# Patient Record
Sex: Male | Born: 1975 | Race: Black or African American | Hispanic: No | Marital: Single | Smoking: Never smoker
Health system: Southern US, Community
[De-identification: ages and names within clinical notes are randomized; demographics above are authoritative.]

## PROBLEM LIST (undated history)

## (undated) ENCOUNTER — Emergency Department (HOSPITAL_COMMUNITY): Payer: Commercial Managed Care - HMO | Source: Home / Self Care

---

## 1998-08-28 ENCOUNTER — Emergency Department (HOSPITAL_COMMUNITY): Admission: EM | Admit: 1998-08-28 | Discharge: 1998-08-28 | Payer: Self-pay | Admitting: Family Medicine

## 1999-10-25 ENCOUNTER — Emergency Department (HOSPITAL_COMMUNITY): Admission: EM | Admit: 1999-10-25 | Discharge: 1999-10-25 | Payer: Self-pay | Admitting: Emergency Medicine

## 2000-09-07 ENCOUNTER — Emergency Department (HOSPITAL_COMMUNITY): Admission: EM | Admit: 2000-09-07 | Discharge: 2000-09-07 | Payer: Self-pay | Admitting: Emergency Medicine

## 2000-10-06 ENCOUNTER — Emergency Department (HOSPITAL_COMMUNITY): Admission: EM | Admit: 2000-10-06 | Discharge: 2000-10-06 | Payer: Self-pay | Admitting: Emergency Medicine

## 2000-11-07 ENCOUNTER — Encounter: Payer: Self-pay | Admitting: Emergency Medicine

## 2000-11-07 ENCOUNTER — Emergency Department (HOSPITAL_COMMUNITY): Admission: EM | Admit: 2000-11-07 | Discharge: 2000-11-07 | Payer: Self-pay | Admitting: Emergency Medicine

## 2003-10-19 ENCOUNTER — Emergency Department (HOSPITAL_COMMUNITY): Admission: EM | Admit: 2003-10-19 | Discharge: 2003-10-19 | Payer: Self-pay | Admitting: Emergency Medicine

## 2005-02-28 ENCOUNTER — Emergency Department (HOSPITAL_COMMUNITY): Admission: EM | Admit: 2005-02-28 | Discharge: 2005-02-28 | Payer: Self-pay | Admitting: Family Medicine

## 2007-05-21 ENCOUNTER — Emergency Department (HOSPITAL_COMMUNITY): Admission: EM | Admit: 2007-05-21 | Discharge: 2007-05-21 | Payer: Self-pay | Admitting: Emergency Medicine

## 2007-05-24 ENCOUNTER — Emergency Department (HOSPITAL_COMMUNITY): Admission: EM | Admit: 2007-05-24 | Discharge: 2007-05-24 | Payer: Self-pay | Admitting: Emergency Medicine

## 2008-02-20 ENCOUNTER — Emergency Department (HOSPITAL_COMMUNITY): Admission: EM | Admit: 2008-02-20 | Discharge: 2008-02-20 | Payer: Self-pay | Admitting: Emergency Medicine

## 2009-09-03 ENCOUNTER — Emergency Department (HOSPITAL_COMMUNITY): Admission: EM | Admit: 2009-09-03 | Discharge: 2009-09-03 | Payer: Self-pay | Admitting: Emergency Medicine

## 2010-10-24 ENCOUNTER — Emergency Department (HOSPITAL_COMMUNITY)
Admission: EM | Admit: 2010-10-24 | Discharge: 2010-10-24 | Payer: Self-pay | Source: Home / Self Care | Admitting: Emergency Medicine

## 2011-09-01 LAB — POCT H PYLORI SCREEN: H. PYLORI SCREEN, POC: NEGATIVE

## 2013-01-03 ENCOUNTER — Encounter (HOSPITAL_COMMUNITY): Payer: Self-pay | Admitting: *Deleted

## 2013-01-03 ENCOUNTER — Emergency Department (HOSPITAL_COMMUNITY)
Admission: EM | Admit: 2013-01-03 | Discharge: 2013-01-04 | Disposition: A | Discharge status: Home / Self Care | Payer: 59 | Attending: Emergency Medicine | Admitting: Emergency Medicine

## 2013-01-03 DIAGNOSIS — R197 Diarrhea, unspecified: Secondary | ICD-10-CM | POA: Insufficient documentation

## 2013-01-03 DIAGNOSIS — B9789 Other viral agents as the cause of diseases classified elsewhere: Secondary | ICD-10-CM | POA: Insufficient documentation

## 2013-01-03 DIAGNOSIS — R109 Unspecified abdominal pain: Secondary | ICD-10-CM | POA: Insufficient documentation

## 2013-01-03 DIAGNOSIS — B349 Viral infection, unspecified: Secondary | ICD-10-CM

## 2013-01-03 DIAGNOSIS — B338 Other specified viral diseases: Secondary | ICD-10-CM | POA: Insufficient documentation

## 2013-01-03 DIAGNOSIS — R509 Fever, unspecified: Secondary | ICD-10-CM | POA: Insufficient documentation

## 2013-01-03 DIAGNOSIS — R112 Nausea with vomiting, unspecified: Secondary | ICD-10-CM | POA: Insufficient documentation

## 2013-01-03 LAB — CBC WITH DIFFERENTIAL/PLATELET
Basophils Absolute: 0 10*3/uL (ref 0.0–0.1)
Basophils Relative: 0 % (ref 0–1)
Eosinophils Absolute: 0 10*3/uL (ref 0.0–0.7)
Eosinophils Relative: 0 % (ref 0–5)
Lymphs Abs: 0.5 10*3/uL — ABNORMAL LOW (ref 0.7–4.0)
MCH: 30.9 pg (ref 26.0–34.0)
MCHC: 35.2 g/dL (ref 30.0–36.0)
MCV: 87.8 fL (ref 78.0–100.0)
Neutrophils Relative %: 89 % — ABNORMAL HIGH (ref 43–77)
Platelets: 137 10*3/uL — ABNORMAL LOW (ref 150–400)
RBC: 4.99 MIL/uL (ref 4.22–5.81)
RDW: 12.1 % (ref 11.5–15.5)

## 2013-01-03 MED ORDER — SODIUM CHLORIDE 0.9 % IV BOLUS (SEPSIS)
1000.0000 mL | Freq: Once | INTRAVENOUS | Status: AC
  Administered 2013-01-03: 1000 mL via INTRAVENOUS

## 2013-01-03 MED ORDER — ONDANSETRON HCL 4 MG/2ML IJ SOLN
4.0000 mg | Freq: Once | INTRAMUSCULAR | Status: AC
  Administered 2013-01-03: 4 mg via INTRAVENOUS
  Filled 2013-01-03: qty 2

## 2013-01-03 MED ORDER — ONDANSETRON HCL 4 MG/2ML IJ SOLN: 4.0000 mg | Freq: Once | INTRAMUSCULAR | Status: DC

## 2013-01-03 NOTE — ED Notes (Signed)
Family at bedside. 

## 2013-01-03 NOTE — ED Notes (Signed)
Pt complains of abdominal pain and nausea, vomiting and diarrhea since this am. Pt also complains of chills.

## 2013-01-03 NOTE — ED Notes (Signed)
Pt states ate chicken around 8 am and developed n/v/d; chills afterwards

## 2013-01-04 LAB — COMPREHENSIVE METABOLIC PANEL
ALT: 32 U/L (ref 0–53)
AST: 36 U/L (ref 0–37)
Albumin: 4.2 g/dL (ref 3.5–5.2)
Alkaline Phosphatase: 59 U/L (ref 39–117)
Calcium: 9.2 mg/dL (ref 8.4–10.5)
GFR calc Af Amer: >90 mL/min (ref >90)
Potassium: 4.2 mEq/L (ref 3.5–5.1)
Sodium: 133 mEq/L — ABNORMAL LOW (ref 135–145)
Total Protein: 8 g/dL (ref 6.0–8.3)

## 2013-01-04 LAB — URINALYSIS, ROUTINE W REFLEX MICROSCOPIC
Bilirubin Urine: NEGATIVE
Hgb urine dipstick: NEGATIVE
Specific Gravity, Urine: 1.02 (ref 1.005–1.030)
Urobilinogen, UA: 1 mg/dL (ref 0.0–1.0)
pH: 6 (ref 5.0–8.0)

## 2013-01-04 MED ORDER — METOCLOPRAMIDE HCL 5 MG/ML IJ SOLN
10.0000 mg | Freq: Once | INTRAMUSCULAR | Status: AC
  Administered 2013-01-04: 10 mg via INTRAVENOUS
  Filled 2013-01-04: qty 2

## 2013-01-04 MED ORDER — ONDANSETRON 8 MG PO TBDP
ORAL_TABLET | ORAL | Status: AC
Start: 2013-01-04 — End: ?

## 2013-01-04 MED ORDER — DIPHENHYDRAMINE HCL 50 MG/ML IJ SOLN
25.0000 mg | Freq: Once | INTRAMUSCULAR | Status: AC
  Administered 2013-01-04: 25 mg via INTRAVENOUS
  Filled 2013-01-04: qty 1

## 2013-01-04 NOTE — ED Provider Notes (Signed)
History     CSN: [id number]  Arrival date & time 01/03/13  2203   First MD Initiated Contact with Patient 01/04/13 0004      Chief Complaint  Patient presents with  . Emesis   HPI  History provided by the patient. Patient is a 37 year old male with no significant PMH who presents with complaints of continued nausea, vomiting and diarrhea symptoms. Symptoms began earlier today after returning home from work around 2 PM. Diarrhea is described as watery without blood or mucus. Patient also has clear emesis and reports that every time he attempts to drink he continues to vomit. He is not been able to eat any solid foods this evening. 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 He does not know of any undercooked or spoiled foods. He does report eating out at [employer] last night. Denies any recent alcohol or drug use. Patient has not used any treatment for his symptoms. Denies any other aggravating or alleviating factors. Symptoms have been associated with some subjective fever, chills and sweats. Denies any other associated symptoms.    History reviewed. No pertinent past medical history.  History reviewed. No pertinent past surgical history.  No family history on file.  History  Substance Use Topics  . Smoking status: Never Smoker   . Smokeless tobacco: Not on file  . Alcohol Use: No      Review of Systems  Constitutional: Positive for fever, chills and appetite change.  Respiratory: Negative for cough and shortness of breath.   Cardiovascular: Negative for chest pain.  Gastrointestinal: Positive for nausea, vomiting, abdominal pain and diarrhea. Negative for constipation and blood in stool.  All other systems reviewed and are negative.    Allergies  Review of patient's allergies indicates no known allergies.  Home Medications  No current outpatient prescriptions on file.  BP 112/72  Pulse 89  Temp(Src) 100.6 F (38.1 C)  Resp 20  SpO2  98%  Physical Exam  Nursing note and vitals reviewed. Constitutional: He is oriented to person, place, and time. He appears well-developed and well-nourished. No distress.  HENT:  Head: Normocephalic.  Eyes: Conjunctivae are normal.  Cardiovascular: Normal rate and regular rhythm.   Pulmonary/Chest: Effort normal and breath sounds normal. No respiratory distress.  Abdominal: Soft. He exhibits no distension. There is no rebound, no guarding, no tenderness at McBurney's point and negative Murphy's sign.  Mild diffuse tenderness.  Musculoskeletal: Normal range of motion.  Neurological: He is alert and oriented to person, place, and time.  Skin: Skin is warm and dry. No pallor.  Psychiatric: He has a normal mood and affect. His behavior is normal.    ED Course  Procedures  Results for orders placed during the hospital encounter of 01/03/13  CBC WITH DIFFERENTIAL      Result Value Range   WBC 9.0  4.0 - 10.5 K/uL   RBC 4.99  4.22 - 5.81 MIL/uL   Hemoglobin 15.4  13.0 - 17.0 g/dL   HCT 40.9  81.1 - 91.4 %   MCV 87.8  78.0 - 100.0 fL   MCH 30.9  26.0 - 34.0 pg   MCHC 35.2  30.0 - 36.0 g/dL   RDW 78.2  95.6 - 21.3 %   Platelets 137 (*) 150 - 400 K/uL   Neutrophils Relative 89 (*) 43 - 77 %   Neutro Abs 8.0 (*) 1.7 - 7.7 K/uL   Lymphocytes Relative 6 (*) 12 -  46 %   Lymphs Abs 0.5 (*) 0.7 - 4.0 K/uL   Monocytes Relative 5  3 - 12 %   Monocytes Absolute 0.5  0.1 - 1.0 K/uL   Eosinophils Relative 0  0 - 5 %   Eosinophils Absolute 0.0  0.0 - 0.7 K/uL   Basophils Relative 0  0 - 1 %   Basophils Absolute 0.0  0.0 - 0.1 K/uL  COMPREHENSIVE METABOLIC PANEL      Result Value Range   Sodium 133 (*) 135 - 145 mEq/L   Potassium 4.2  3.5 - 5.1 mEq/L   Chloride 96  96 - 112 mEq/L   CO2 26  19 - 32 mEq/L   Glucose, Bld 116 (*) 70 - 99 mg/dL   BUN 15  6 - 23 mg/dL   Creatinine, Ser 1.19  0.50 - 1.35 mg/dL   Calcium 9.2  8.4 - 14.7 mg/dL   Total Protein 8.0  6.0 - 8.3 g/dL   Albumin 4.2   3.5 - 5.2 g/dL   AST 36  0 - 37 U/L   ALT 32  0 - 53 U/L   Alkaline Phosphatase 59  39 - 117 U/L   Total Bilirubin 0.6  0.3 - 1.2 mg/dL   GFR calc non Af Amer >90  >90 mL/min   GFR calc Af Amer >90  >90 mL/min  LIPASE, BLOOD      Result Value Range   Lipase 17  11 - 59 U/L        1. Nausea vomiting and diarrhea   2. Viral syndrome       MDM  12:25 AM patient seen and evaluated. Patient lying in bed appears comfortable in no acute distress. Reports some improvement of nausea symptoms after IV fluids and Zofran. Abdomen soft without peritoneal signs.  Patient feeling better after fluids and Zofran. Now tolerating by mouth fluids. No concerning findings. At this time stable for discharge home.      Angus Seller, PA 01/04/13 2015

## 2013-01-04 NOTE — ED Notes (Signed)
After drinking ginger ale pt denies abdominal pain but does endorse mild nausea

## 2013-01-09 NOTE — ED Provider Notes (Signed)
Medical screening examination/treatment/procedure(s) were performed by non-physician practitioner and as supervising physician I was immediately available for consultation/collaboration.  Cowen Pesqueira M Shima Compere, MD 01/09/13 1910 

## 2015-11-01 ENCOUNTER — Encounter (HOSPITAL_COMMUNITY): Payer: Self-pay | Admitting: Emergency Medicine

## 2015-11-01 ENCOUNTER — Emergency Department (HOSPITAL_COMMUNITY)
Admission: EM | Admit: 2015-11-01 | Discharge: 2015-11-01 | Disposition: A | Discharge status: Home / Self Care | Payer: Commercial Managed Care - HMO | Attending: Emergency Medicine | Admitting: Emergency Medicine

## 2015-11-01 ENCOUNTER — Emergency Department (HOSPITAL_COMMUNITY): Payer: Commercial Managed Care - HMO

## 2015-11-01 DIAGNOSIS — J40 Bronchitis, not specified as acute or chronic: Secondary | ICD-10-CM | POA: Diagnosis not present

## 2015-11-01 DIAGNOSIS — B349 Viral infection, unspecified: Secondary | ICD-10-CM | POA: Insufficient documentation

## 2015-11-01 DIAGNOSIS — R079 Chest pain, unspecified: Secondary | ICD-10-CM | POA: Diagnosis present

## 2015-11-01 DIAGNOSIS — J069 Acute upper respiratory infection, unspecified: Secondary | ICD-10-CM | POA: Diagnosis not present

## 2015-11-01 LAB — CBC
HEMATOCRIT: 45.6 % (ref 39.0–52.0)
HEMOGLOBIN: 15.7 g/dL (ref 13.0–17.0)
MCH: 30.5 pg (ref 26.0–34.0)
MCHC: 34.4 g/dL (ref 30.0–36.0)
MCV: 88.7 fL (ref 78.0–100.0)
Platelets: 136 10*3/uL — ABNORMAL LOW (ref 150–400)
RBC: 5.14 MIL/uL (ref 4.22–5.81)
RDW: 12.3 % (ref 11.5–15.5)
WBC: 5.5 10*3/uL (ref 4.0–10.5)

## 2015-11-01 LAB — BASIC METABOLIC PANEL
ANION GAP: 9 (ref 5–15)
BUN: 11 mg/dL (ref 6–20)
CHLORIDE: 102 mmol/L (ref 101–111)
CO2: 25 mmol/L (ref 22–32)
Calcium: 9.2 mg/dL (ref 8.9–10.3)
Creatinine, Ser: 1.06 mg/dL (ref 0.61–1.24)
Glucose, Bld: 104 mg/dL — ABNORMAL HIGH (ref 65–99)
POTASSIUM: 4.1 mmol/L (ref 3.5–5.1)
SODIUM: 136 mmol/L (ref 135–145)

## 2015-11-01 LAB — I-STAT TROPONIN, ED: Troponin i, poc: 0.01 ng/mL (ref 0.00–0.08)

## 2015-11-01 MED ORDER — ONDANSETRON 4 MG PO TBDP: 4.0000 mg | ORAL_TABLET | Freq: Three times a day (TID) | ORAL | Status: AC | PRN

## 2015-11-01 MED ORDER — FLUTICASONE PROPIONATE 50 MCG/ACT NA SUSP: 2.0000 | Freq: Every day | NASAL | Status: AC

## 2015-11-01 MED ORDER — GUAIFENESIN ER 600 MG PO TB12: 600.0000 mg | ORAL_TABLET | Freq: Two times a day (BID) | ORAL | Status: AC

## 2015-11-01 NOTE — ED Notes (Signed)
Pt c/o chest pain intermittent for two weeks---- has also had a cough for two weeks, "has not felt good for two weeks--nasal and chest congestion." coughing productive for green and yellow sputum.

## 2015-11-01 NOTE — Discharge Instructions (Signed)
You were seen in the ED today for evaluation of bronchitis and upper respiratory infection. Your chest x-ray and labs are normal. Your symptoms are likely due to a virus. However, given the duration of your symptoms, I agree that it is reasonable to try antibiotics so please continue to take and finish the antibiotics you received at urgent care.  I will also give you prescriptions for flonase (nasal spray), mucinex (expectorant), and Zofran (anti-nausea medicine) to take. Please contact one of the clinics below to establish primary care and make an appointment to follow up within one week. Return to the ER for new or worsening symptoms such as high fever, intractable vomiting, etc.   Take medications as prescribed. Return to the emergency room for worsening condition or new concerning symptoms. Follow up with your regular doctor. If you don't have a regular doctor use one of the numbers below to establish a primary care doctor.   Emergency Department Resource Guide 1) Find a Doctor and Pay Out of Pocket Although you won't have to find out who is covered by your insurance plan, it is a good idea to ask around and get recommendations. You will then need to call the office and see if the doctor you have chosen will accept you as a new patient and what types of options they offer for patients who are self-pay. Some doctors offer discounts or will set up payment plans for their patients who do not have insurance, but you will need to ask so you aren't surprised when you get to your appointment.  2) Contact Your Local Health Department Not all health departments have doctors that can see patients for sick visits, but many do, so it is worth a call to see if yours does. If you don't know where your local health department is, you can check in your phone book. The CDC also has a tool to help you locate your state's health department, and many state websites also have listings of all of their local health  departments.  3) Find a Walk-in Clinic If your illness is not likely to be very severe or complicated, you may want to try a walk in clinic. These are popping up all over the country in pharmacies, drugstores, and shopping centers. They're usually staffed by nurse practitioners or physician assistants that have been trained to treat common illnesses and complaints. They're usually fairly quick and inexpensive. However, if you have serious medical issues or chronic medical problems, these are probably not your best option.  No Primary Care Doctor: - Call Health Connect at  [phone] - they can help you locate a primary care doctor that  accepts your insurance, provides certain services, etc. - Physician Referral [phone]- [phone]  Emergency Department Resource Guide 1) Find a Doctor and Pay Out of Pocket Although you won't have to find out who is covered by your insurance plan, it is a good idea to ask around and get recommendations. You will then need to call the office and see if the doctor you have chosen will accept you as a new patient and what types of options they offer for patients who are self-pay. Some doctors offer discounts or will set up payment plans for their patients who do not have insurance, but you will need to ask so you aren't surprised when you get to your appointment.  2) Contact Your Local Health Department Not all health departments have doctors that can see patients for sick visits, but many do, so  it is worth a call to see if yours does. If you don't know where your local health department is, you can check in your phone book. The CDC also has a tool to help you locate your state's health department, and many state websites also have listings of all of their local health departments.  3) Find a Walk-in Clinic If your illness is not likely to be very severe or complicated, you may want to try a walk in clinic. These are popping up all over the country in pharmacies,  drugstores, and shopping centers. They're usually staffed by nurse practitioners or physician assistants that have been trained to treat common illnesses and complaints. They're usually fairly quick and inexpensive. However, if you have serious medical issues or chronic medical problems, these are probably not your best option.  No Primary Care Doctor: - Call Health Connect at  [phone] - they can help you locate a primary care doctor that  accepts your insurance, provides certain services, etc. - Physician Referral Service- [phone]  Chronic Pain Problems: Organization         Address  Phone   Notes  [facility]  [phone] Patients need to be referred by their primary care doctor.   Medication Assistance: Organization         Address  Phone   Notes  [facility] Medication [facility] [address] [place], [place] [address] [phone] --Must be a resident of [facility] -- Must have NO insurance coverage whatsoever (no Medicaid/ Medicare, etc.) -- The pt. MUST have a primary care doctor that directs their care regularly and follows them in the community   [facility]  [phone]   [employer]  [phone]    Agencies that provide inexpensive medical care: Organization         Address  Phone   Notes  [facility]  [phone]   [facility]    [phone]   [facility] [address] [place], [place] [address] [phone]   [facility] [address], [place] [phone]   [facility]    [phone]   [facility]    [phone]   [facility]  [address], [place] Phone:  [phone], Fax:  [phone] Hours of Operation:  9 am - 6 pm, M-F.  Also accepts Medicaid/Medicare and self-pay.  [facility]  [address], [place] Phone:  [phone], Fax: [phone]. Hours of Operation:  8:30 am - 5:30 pm, M-F.  Also accepts Medicaid and self-pay.  [facility] [place] [address], [place] Phone: [phone]   [facility] [address] Natasha Bence [place], [place] [phone], Ext. [phone] Mondays & Thursdays: 7-9 AM.  First 15 patients are seen on a first come, first serve basis.    Medicaid-accepting [facility] Providers:  Organization         Address  Phone   Notes  [facility] [address], [place] [phone] Also accepts self-pay patients.  [facility] [address] Laurell Josephs [place], [place]  [phone]   [facility] [address], [place] [phone]   [facility] [address], [place] [phone]   Renaye Rakers [address], [place]   [phone] Only accepts [place] patients after they have their name applied to their card.   Self-Pay (no insurance) in [facility]:  Organization         Address  Phone   Notes  Sickle Cell Patients, [facility] [address] [place], [place] [phone]   [facility] [address] [place], [place] [phone]   [facility] [place]  [address], [place] [phone]   [facility]/Dr. Osei-Bonsu  [address] [place] or [address], [place] [phone] Phone number for both [place] locations is the same.  [facility] [address], [place] [phone]   [facility] [address] [place] or [address] [phone]   [facility] [address] [place] [phone], phone; [phone], fax Sees patients 1st and 3rd Saturday of every month.  Must not qualify for public  or private insurance (i.e. Medicaid, Medicare, [place] [employer] Choice, Veterans' Benefits)  Household income should be no more than 200% of the poverty level The clinic cannot treat you if you are pregnant or think you are pregnant  Sexually transmitted diseases are not treated at the clinic.

## 2015-11-01 NOTE — ED Provider Notes (Signed)
CSN: [id number]     Arrival date & time 11/01/15  1636 History   First MD Initiated Contact with Patient 11/01/15 1748     Chief Complaint  Patient presents with  . Chest Pain  . URI    HPI   Mr. Cameron Krause is an 39 y.o. male who presents to the ED for evaluation of URI. He states his symptoms began two weeks ago. He reports cough productive of yellow-white sputum. He reports associated shortness of breath with his coughing fits. Denies feeling faint or lightheaded. Denies hemoptysis. He states he has a distant history of asthma and has an inhaler at home but has not used it in years. Pt states he went to an urgent a few days ago and was prescribed Augmentin and Tessalon perles with no relief. He states he has been taking the antibiotic as prescribed and still has a few days left. Endorses chills and temperature around 44F at home. Endorses intermittent nausea with three episodes of NBNB emesis over the past two weeks. Endorses some loose stools since starting Augmentin. Denies sick contacts. Endorses occasional EtOH but denies tobacco, MJ, or other drug use.  History reviewed. No pertinent past medical history. History reviewed. No pertinent past surgical history. No family history on file. Social History  Substance Use Topics  . Smoking status: Never Smoker   . Smokeless tobacco: None  . Alcohol Use: No     Comment: socially    Review of Systems  All other systems reviewed and are negative.     Allergies  Review of patient's allergies indicates no known allergies.  Home Medications   Prior to Admission medications   Medication Sig Start Date End Date Taking? Authorizing Provider  ondansetron (ZOFRAN ODT) 8 MG disintegrating tablet 8mg  ODT q4 hours prn nausea 01/04/13   Peter Dammen, PA-C   BP 122/84 mmHg  Pulse 64  Temp(Src) 99.5 F (37.5 C) (Oral)  Resp 16  Ht 6\' 1"  (1.854 m)  Wt 131.997 kg  BMI 38.40 kg/m2  SpO2 95% Physical Exam  Constitutional: He is oriented to  person, place, and time. No distress.  HENT:  Right Ear: External ear normal.  Left Ear: External ear normal.  Nose: Nose normal.  Mouth/Throat: Oropharynx is clear and moist. No oropharyngeal exudate.  Eyes: Conjunctivae and EOM are normal. Pupils are equal, round, and reactive to light.  Neck: Normal range of motion. Neck supple.  Cardiovascular: Normal rate, regular rhythm, normal heart sounds and intact distal pulses.   Pulmonary/Chest: Effort normal and breath sounds normal. No respiratory distress. He has no wheezes. He has no rales. He exhibits no tenderness.  Abdominal: Soft. Bowel sounds are normal. He exhibits no distension. There is no tenderness.  Musculoskeletal: He exhibits no edema or tenderness.  Lymphadenopathy:    He has no cervical adenopathy.  Neurological: He is alert and oriented to person, place, and time. No cranial nerve deficit. Coordination normal.  Skin: Skin is warm and dry. He is not diaphoretic.  Psychiatric: He has a normal mood and affect.  Nursing note and vitals reviewed.  Filed Vitals:   11/01/15 1649 11/01/15 1830 11/01/15 1900  BP: 122/84 126/87 126/82  Pulse: 64 63 66  Temp: 99.5 F (37.5 C)    TempSrc: Oral    Resp: 16 15 16   Height: 6\' 1"  (1.854 m)    Weight: 131.997 kg    SpO2: 95% 97% 96%     ED Course  Procedures (including critical care time)  Labs Review Labs Reviewed  BASIC METABOLIC PANEL - Abnormal; Notable for the following:    Glucose, Bld 104 (*)    All other components within normal limits  CBC - Abnormal; Notable for the following:    Platelets 136 (*)    All other components within normal limits  I-STAT TROPOININ, ED    Imaging Review Dg Chest 2 View  11/01/2015  CLINICAL DATA:  Chest pain, productive cough, and shortness of breath for 2 weeks. EXAM: CHEST  2 VIEW COMPARISON:  05/21/2007 FINDINGS: The heart size and mediastinal contours are within normal limits. Both lungs are clear. The visualized skeletal  structures are unremarkable. IMPRESSION: No active cardiopulmonary disease. Electronically Signed   By: Myles Rosenthal M.D.   On: 11/01/2015 17:15   I have personally reviewed and evaluated these images and lab results as part of my medical decision-making.   EKG Interpretation None      MDM   Final diagnoses:  Bronchitis  Viral syndrome  Upper respiratory infection    Mr. Cameron Krause is an 39 y.o. male with no significant PMH who presents to the ED for URI/bronchitis. HIs labs are unremarkable. CXR is negative for pneumonia or other acute abnormalities. He is afebrile, not tachycardic, and has maintained appropriate SpO2 on room air. I suspect likely viral etiology though given duration of symptoms agree with urgent care's prescription for Augmentin and instructed pt to finish course. His EKG is abnormal but pt states that he has chronically had abnormal EKGs and been evaluated by cardiology in the past with no remarkable workup. His chest pain is likely due to his coughing/spasms. HEART score 1 and I do not suspect this is ACS. His lungs sound clear on my exam with no wheezing or crackles and with good air movement. Instructed pt that he may use his albuterol inhaler prn. Will also give rx for flonase, mucinex, and zofran. ER return precautions given.     Carlene Coria, PA-C 11/01/15 2257  Benjiman Core, MD 11/01/15 [phone]

## 2015-11-01 NOTE — ED Notes (Signed)
EDP at bedside  

## 2018-04-15 ENCOUNTER — Emergency Department (HOSPITAL_COMMUNITY): Payer: 59

## 2018-04-15 ENCOUNTER — Inpatient Hospital Stay (HOSPITAL_COMMUNITY)
Admission: EM | Admit: 2018-04-15 | Discharge: 2018-05-16 | DRG: 025 | Disposition: E | Payer: 59 | Attending: General Surgery | Admitting: General Surgery

## 2018-04-15 ENCOUNTER — Other Ambulatory Visit: Payer: Self-pay

## 2018-04-15 ENCOUNTER — Inpatient Hospital Stay (HOSPITAL_COMMUNITY): Payer: 59

## 2018-04-15 DIAGNOSIS — S22048A Other fracture of fourth thoracic vertebra, initial encounter for closed fracture: Secondary | ICD-10-CM | POA: Diagnosis present

## 2018-04-15 DIAGNOSIS — S20211A Contusion of right front wall of thorax, initial encounter: Secondary | ICD-10-CM | POA: Diagnosis present

## 2018-04-15 DIAGNOSIS — R402112 Coma scale, eyes open, never, at arrival to emergency department: Secondary | ICD-10-CM | POA: Diagnosis present

## 2018-04-15 DIAGNOSIS — D696 Thrombocytopenia, unspecified: Secondary | ICD-10-CM | POA: Diagnosis not present

## 2018-04-15 DIAGNOSIS — S062X9A Diffuse traumatic brain injury with loss of consciousness of unspecified duration, initial encounter: Secondary | ICD-10-CM | POA: Diagnosis present

## 2018-04-15 DIAGNOSIS — J9811 Atelectasis: Secondary | ICD-10-CM | POA: Diagnosis present

## 2018-04-15 DIAGNOSIS — I619 Nontraumatic intracerebral hemorrhage, unspecified: Secondary | ICD-10-CM | POA: Diagnosis present

## 2018-04-15 DIAGNOSIS — G935 Compression of brain: Secondary | ICD-10-CM | POA: Diagnosis not present

## 2018-04-15 DIAGNOSIS — R402124 Coma scale, eyes open, to pain, 24 hours or more after hospital admission: Secondary | ICD-10-CM | POA: Diagnosis not present

## 2018-04-15 DIAGNOSIS — S0240EA Zygomatic fracture, right side, initial encounter for closed fracture: Secondary | ICD-10-CM | POA: Diagnosis present

## 2018-04-15 DIAGNOSIS — E870 Hyperosmolality and hypernatremia: Secondary | ICD-10-CM | POA: Diagnosis present

## 2018-04-15 DIAGNOSIS — R402312 Coma scale, best motor response, none, at arrival to emergency department: Secondary | ICD-10-CM | POA: Diagnosis present

## 2018-04-15 DIAGNOSIS — G9382 Brain death: Secondary | ICD-10-CM | POA: Diagnosis not present

## 2018-04-15 DIAGNOSIS — N179 Acute kidney failure, unspecified: Secondary | ICD-10-CM | POA: Diagnosis present

## 2018-04-15 DIAGNOSIS — Z5289 Donor of other specified organs or tissues: Secondary | ICD-10-CM

## 2018-04-15 DIAGNOSIS — Z4659 Encounter for fitting and adjustment of other gastrointestinal appliance and device: Secondary | ICD-10-CM

## 2018-04-15 DIAGNOSIS — R6521 Severe sepsis with septic shock: Secondary | ICD-10-CM | POA: Diagnosis not present

## 2018-04-15 DIAGNOSIS — S0281XA Fracture of other specified skull and facial bones, right side, initial encounter for closed fracture: Secondary | ICD-10-CM | POA: Diagnosis present

## 2018-04-15 DIAGNOSIS — J80 Acute respiratory distress syndrome: Secondary | ICD-10-CM | POA: Diagnosis not present

## 2018-04-15 DIAGNOSIS — R079 Chest pain, unspecified: Secondary | ICD-10-CM

## 2018-04-15 DIAGNOSIS — R0902 Hypoxemia: Secondary | ICD-10-CM

## 2018-04-15 DIAGNOSIS — Z9911 Dependence on respirator [ventilator] status: Secondary | ICD-10-CM

## 2018-04-15 DIAGNOSIS — S20212A Contusion of left front wall of thorax, initial encounter: Secondary | ICD-10-CM | POA: Diagnosis present

## 2018-04-15 DIAGNOSIS — S22038A Other fracture of third thoracic vertebra, initial encounter for closed fracture: Secondary | ICD-10-CM | POA: Diagnosis present

## 2018-04-15 DIAGNOSIS — R402224 Coma scale, best verbal response, incomprehensible words, 24 hours or more after hospital admission: Secondary | ICD-10-CM | POA: Diagnosis not present

## 2018-04-15 DIAGNOSIS — R402324 Coma scale, best motor response, extension, 24 hours or more after hospital admission: Secondary | ICD-10-CM | POA: Diagnosis not present

## 2018-04-15 DIAGNOSIS — H05221 Edema of right orbit: Secondary | ICD-10-CM | POA: Diagnosis present

## 2018-04-15 DIAGNOSIS — S299XXA Unspecified injury of thorax, initial encounter: Secondary | ICD-10-CM

## 2018-04-15 DIAGNOSIS — E232 Diabetes insipidus: Secondary | ICD-10-CM | POA: Diagnosis not present

## 2018-04-15 DIAGNOSIS — Z01818 Encounter for other preprocedural examination: Secondary | ICD-10-CM

## 2018-04-15 DIAGNOSIS — J9601 Acute respiratory failure with hypoxia: Secondary | ICD-10-CM | POA: Diagnosis not present

## 2018-04-15 DIAGNOSIS — D62 Acute posthemorrhagic anemia: Secondary | ICD-10-CM | POA: Diagnosis present

## 2018-04-15 DIAGNOSIS — R739 Hyperglycemia, unspecified: Secondary | ICD-10-CM | POA: Diagnosis not present

## 2018-04-15 DIAGNOSIS — R402212 Coma scale, best verbal response, none, at arrival to emergency department: Secondary | ICD-10-CM | POA: Diagnosis present

## 2018-04-15 DIAGNOSIS — R069 Unspecified abnormalities of breathing: Secondary | ICD-10-CM

## 2018-04-15 DIAGNOSIS — K567 Ileus, unspecified: Secondary | ICD-10-CM

## 2018-04-15 DIAGNOSIS — S0231XB Fracture of orbital floor, right side, initial encounter for open fracture: Secondary | ICD-10-CM | POA: Diagnosis present

## 2018-04-15 DIAGNOSIS — S0282XA Fracture of other specified skull and facial bones, left side, initial encounter for closed fracture: Secondary | ICD-10-CM | POA: Diagnosis present

## 2018-04-15 DIAGNOSIS — J690 Pneumonitis due to inhalation of food and vomit: Secondary | ICD-10-CM

## 2018-04-15 DIAGNOSIS — J9602 Acute respiratory failure with hypercapnia: Secondary | ICD-10-CM | POA: Diagnosis present

## 2018-04-15 DIAGNOSIS — A419 Sepsis, unspecified organism: Secondary | ICD-10-CM | POA: Diagnosis not present

## 2018-04-15 DIAGNOSIS — R918 Other nonspecific abnormal finding of lung field: Secondary | ICD-10-CM | POA: Diagnosis not present

## 2018-04-15 DIAGNOSIS — S42192A Fracture of other part of scapula, left shoulder, initial encounter for closed fracture: Secondary | ICD-10-CM | POA: Diagnosis present

## 2018-04-15 DIAGNOSIS — Z529 Donor of unspecified organ or tissue: Secondary | ICD-10-CM | POA: Diagnosis not present

## 2018-04-15 DIAGNOSIS — J9600 Acute respiratory failure, unspecified whether with hypoxia or hypercapnia: Secondary | ICD-10-CM

## 2018-04-15 DIAGNOSIS — J9690 Respiratory failure, unspecified, unspecified whether with hypoxia or hypercapnia: Secondary | ICD-10-CM

## 2018-04-15 DIAGNOSIS — E876 Hypokalemia: Secondary | ICD-10-CM | POA: Diagnosis present

## 2018-04-15 DIAGNOSIS — J988 Other specified respiratory disorders: Secondary | ICD-10-CM

## 2018-04-15 LAB — CBC
HEMATOCRIT: 46.2 % (ref 39.0–52.0)
Hemoglobin: 15.4 g/dL (ref 13.0–17.0)
MCH: 30.1 pg (ref 26.0–34.0)
MCHC: 33.3 g/dL (ref 30.0–36.0)
MCV: 90.4 fL (ref 78.0–100.0)
PLATELETS: 152 10*3/uL (ref 150–400)
RBC: 5.11 MIL/uL (ref 4.22–5.81)
RDW: 12.2 % (ref 11.5–15.5)
WBC: 18.8 10*3/uL — ABNORMAL HIGH (ref 4.0–10.5)

## 2018-04-15 LAB — URINALYSIS, ROUTINE W REFLEX MICROSCOPIC
BACTERIA UA: NONE SEEN
Bilirubin Urine: NEGATIVE
Glucose, UA: NEGATIVE mg/dL
KETONES UR: NEGATIVE mg/dL
Leukocytes, UA: NEGATIVE
Nitrite: NEGATIVE
PH: 6 (ref 5.0–8.0)
PROTEIN: 30 mg/dL — AB
Specific Gravity, Urine: >1.046 — ABNORMAL HIGH (ref 1.005–1.030)

## 2018-04-15 LAB — I-STAT CHEM 8, ED
BUN: 16 mg/dL (ref 6–20)
CHLORIDE: 104 mmol/L (ref 101–111)
CREATININE: 1.3 mg/dL — AB (ref 0.61–1.24)
Calcium, Ion: 1.02 mmol/L — ABNORMAL LOW (ref 1.15–1.40)
Glucose, Bld: 159 mg/dL — ABNORMAL HIGH (ref 65–99)
HEMATOCRIT: 48 % (ref 39.0–52.0)
Hemoglobin: 16.3 g/dL (ref 13.0–17.0)
POTASSIUM: 3.5 mmol/L (ref 3.5–5.1)
Sodium: 140 mmol/L (ref 135–145)
TCO2: 23 mmol/L (ref 22–32)

## 2018-04-15 LAB — PREPARE FRESH FROZEN PLASMA: Unit division: 0

## 2018-04-15 LAB — COMPREHENSIVE METABOLIC PANEL
ALK PHOS: 76 U/L (ref 38–126)
ALT: 30 U/L (ref 17–63)
AST: 60 U/L — AB (ref 15–41)
Albumin: 4 g/dL (ref 3.5–5.0)
Anion gap: 11 (ref 5–15)
BILIRUBIN TOTAL: 1.1 mg/dL (ref 0.3–1.2)
BUN: 14 mg/dL (ref 6–20)
CALCIUM: 8.7 mg/dL — AB (ref 8.9–10.3)
CO2: 23 mmol/L (ref 22–32)
CREATININE: 1.47 mg/dL — AB (ref 0.61–1.24)
Chloride: 105 mmol/L (ref 101–111)
GFR calc Af Amer: >60 mL/min (ref >60)
GFR, EST NON AFRICAN AMERICAN: 58 mL/min — AB (ref >60)
GLUCOSE: 159 mg/dL — AB (ref 65–99)
POTASSIUM: 3.9 mmol/L (ref 3.5–5.1)
Sodium: 139 mmol/L (ref 135–145)
TOTAL PROTEIN: 6.9 g/dL (ref 6.5–8.1)

## 2018-04-15 LAB — PROTIME-INR
INR: 1.21
PROTHROMBIN TIME: 15.2 s (ref 11.4–15.2)

## 2018-04-15 LAB — BPAM FFP
BLOOD PRODUCT EXPIRATION DATE: 201906052359
Blood Product Expiration Date: 201906052359
ISSUE DATE / TIME: 201905312105
ISSUE DATE / TIME: 201905312105
UNIT TYPE AND RH: 6200
Unit Type and Rh: 6200

## 2018-04-15 LAB — I-STAT ARTERIAL BLOOD GAS, ED
ACID-BASE DEFICIT: 2 mmol/L (ref 0.0–2.0)
BICARBONATE: 23.8 mmol/L (ref 20.0–28.0)
O2 Saturation: 97 %
TCO2: 25 mmol/L (ref 22–32)
pCO2 arterial: 44.8 mmHg (ref 32.0–48.0)
pH, Arterial: 7.332 — ABNORMAL LOW (ref 7.350–7.450)
pO2, Arterial: 92 mmHg (ref 83.0–108.0)

## 2018-04-15 LAB — CDS SEROLOGY

## 2018-04-15 LAB — ABO/RH: ABO/RH(D): B NEG

## 2018-04-15 LAB — I-STAT CG4 LACTIC ACID, ED: Lactic Acid, Venous: 4.36 mmol/L (ref 0.5–1.9)

## 2018-04-15 LAB — ETHANOL: Alcohol, Ethyl (B): <10 mg/dL (ref <10)

## 2018-04-15 MED ORDER — PIPERACILLIN-TAZOBACTAM 3.375 G IVPB
3.3750 g | Freq: Three times a day (TID) | INTRAVENOUS | Status: DC
  Administered 2018-04-16 – 2018-04-26 (×30): 3.375 g via INTRAVENOUS
  Filled 2018-04-15 (×32): qty 50

## 2018-04-15 MED ORDER — PROPOFOL 1000 MG/100ML IV EMUL
INTRAVENOUS | Status: AC | PRN
  Administered 2018-04-15: 14.697 ug/kg/min via INTRAVENOUS

## 2018-04-15 MED ORDER — PANTOPRAZOLE SODIUM 40 MG PO TBEC
40.0000 mg | DELAYED_RELEASE_TABLET | Freq: Two times a day (BID) | ORAL | Status: DC
  Filled 2018-04-15: qty 1

## 2018-04-15 MED ORDER — CEFAZOLIN SODIUM-DEXTROSE 2-4 GM/100ML-% IV SOLN
INTRAVENOUS | Status: AC
  Filled 2018-04-15: qty 100

## 2018-04-15 MED ORDER — LACTATED RINGERS IV BOLUS
1000.0000 mL | Freq: Once | INTRAVENOUS | Status: AC
  Administered 2018-04-15: 1000 mL via INTRAVENOUS

## 2018-04-15 MED ORDER — IOHEXOL 300 MG/ML  SOLN
100.0000 mL | Freq: Once | INTRAMUSCULAR | Status: AC | PRN
  Administered 2018-04-15: 100 mL via INTRAVENOUS

## 2018-04-15 MED ORDER — ETOMIDATE 2 MG/ML IV SOLN
INTRAVENOUS | Status: AC | PRN
  Administered 2018-04-15: 30 mg via INTRAVENOUS

## 2018-04-15 MED ORDER — POTASSIUM CHLORIDE IN NACL 20-0.9 MEQ/L-% IV SOLN
INTRAVENOUS | Status: DC
  Administered 2018-04-16 – 2018-04-17 (×3): via INTRAVENOUS
  Filled 2018-04-15 (×4): qty 1000

## 2018-04-15 MED ORDER — IOPAMIDOL (ISOVUE-370) INJECTION 76%
50.0000 mL | Freq: Once | INTRAVENOUS | Status: AC | PRN
  Administered 2018-04-16: 50 mL via INTRAVENOUS

## 2018-04-15 MED ORDER — FENTANYL CITRATE (PF) 100 MCG/2ML IJ SOLN
50.0000 ug | Freq: Once | INTRAMUSCULAR | Status: AC
  Administered 2018-04-16: 50 ug via INTRAVENOUS
  Filled 2018-04-15: qty 2

## 2018-04-15 MED ORDER — PROPOFOL 1000 MG/100ML IV EMUL
0.0000 ug/kg/min | INTRAVENOUS | Status: DC
  Administered 2018-04-16: 10 ug/kg/min via INTRAVENOUS
  Administered 2018-04-16: 15 ug/kg/min via INTRAVENOUS
  Administered 2018-04-16 (×2): 10 ug/kg/min via INTRAVENOUS
  Administered 2018-04-17 (×3): 12.5 ug/kg/min via INTRAVENOUS
  Administered 2018-04-18: 10 ug/kg/min via INTRAVENOUS
  Administered 2018-04-18: 12.5 ug/kg/min via INTRAVENOUS
  Administered 2018-04-19: 10 ug/kg/min via INTRAVENOUS
  Filled 2018-04-15 (×8): qty 100

## 2018-04-15 MED ORDER — FENTANYL BOLUS VIA INFUSION
50.0000 ug | INTRAVENOUS | Status: DC | PRN
  Filled 2018-04-15: qty 50

## 2018-04-15 MED ORDER — FAMOTIDINE IN NACL 20-0.9 MG/50ML-% IV SOLN
20.0000 mg | Freq: Two times a day (BID) | INTRAVENOUS | Status: DC
  Administered 2018-04-16 – 2018-04-18 (×4): 20 mg via INTRAVENOUS
  Filled 2018-04-15 (×5): qty 50

## 2018-04-15 MED ORDER — PIPERACILLIN-TAZOBACTAM 3.375 G IVPB 30 MIN
3.3750 g | Freq: Once | INTRAVENOUS | Status: AC
  Administered 2018-04-15: 3.375 g via INTRAVENOUS
  Filled 2018-04-15: qty 50

## 2018-04-15 MED ORDER — DOCUSATE SODIUM 50 MG/5ML PO LIQD
100.0000 mg | Freq: Two times a day (BID) | ORAL | Status: DC | PRN
  Administered 2018-04-20 – 2018-04-22 (×3): 100 mg
  Filled 2018-04-15 (×4): qty 10

## 2018-04-15 MED ORDER — FENTANYL 2500MCG IN NS 250ML (10MCG/ML) PREMIX INFUSION
25.0000 ug/h | INTRAVENOUS | Status: DC
  Administered 2018-04-16: 75 ug/h via INTRAVENOUS
  Administered 2018-04-16: 50 ug/h via INTRAVENOUS
  Administered 2018-04-16: 150 ug/h via INTRAVENOUS
  Administered 2018-04-17: 50 ug/h via INTRAVENOUS
  Administered 2018-04-17 – 2018-04-19 (×4): 150 ug/h via INTRAVENOUS
  Administered 2018-04-20: 175 ug/h via INTRAVENOUS
  Administered 2018-04-20: 150 ug/h via INTRAVENOUS
  Filled 2018-04-15 (×8): qty 250

## 2018-04-15 MED ORDER — SUCCINYLCHOLINE CHLORIDE 20 MG/ML IJ SOLN
INTRAMUSCULAR | Status: AC | PRN
  Administered 2018-04-15: 120 mg via INTRAVENOUS

## 2018-04-15 MED ORDER — CEFAZOLIN SODIUM-DEXTROSE 2-4 GM/100ML-% IV SOLN
2.0000 g | Freq: Once | INTRAVENOUS | Status: AC
  Administered 2018-04-15: 2 g via INTRAVENOUS

## 2018-04-15 MED ORDER — PROPOFOL 1000 MG/100ML IV EMUL
INTRAVENOUS | Status: AC
  Filled 2018-04-15: qty 100

## 2018-04-15 NOTE — ED Notes (Signed)
Nurse will draw labs. 

## 2018-04-15 NOTE — Progress Notes (Signed)
Pharmacy Antibiotic Note  Tobias Avitabile is a 42 y.o. male admitted on May 14, 2018 with pneumonia.  Pharmacy has been consulted for zosyn dosing.  Plan: Zosyn 3.375g IV q8h (4 hour infusion). F/u cultures and clinical course  Weight: 250 lb (113.4 kg)  Temp (24hrs), Avg:98 F (36.7 C), Min:97.7 F (36.5 C), Max:98.2 F (36.8 C)  Recent Labs  Lab 05-14-2018 2105 2018/05/14 2121 14-May-2018 2126  WBC 18.8*  --   --   CREATININE 1.47* 1.30*  --   LATICACIDVEN  --   --  4.36*    CrCl cannot be calculated (Unknown ideal weight.).    Allergies not on file   Thank you for allowing pharmacy to be a part of this patient's care.  Talbert Cage [place] May 14, 2018 11:04 PM

## 2018-04-15 NOTE — ED Notes (Signed)
Paged Dr. Leta Baptist to [phone]
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
## 2018-04-15 NOTE — Progress Notes (Signed)
Pt transported to CT and back without complications. 
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
## 2018-04-15 NOTE — ED Notes (Signed)
Paged Dr. Newell CoralNudelman to [phone]

## 2018-04-16 ENCOUNTER — Inpatient Hospital Stay: Payer: Self-pay

## 2018-04-16 ENCOUNTER — Inpatient Hospital Stay (HOSPITAL_COMMUNITY): Payer: 59

## 2018-04-16 ENCOUNTER — Encounter (HOSPITAL_COMMUNITY): Payer: Self-pay | Admitting: Emergency Medicine

## 2018-04-16 LAB — POCT I-STAT 3, ART BLOOD GAS (G3+)
ACID-BASE DEFICIT: 9 mmol/L — AB (ref 0.0–2.0)
BICARBONATE: 17.7 mmol/L — AB (ref 20.0–28.0)
O2 SAT: 90 %
PO2 ART: 68 mmHg — AB (ref 83.0–108.0)
TCO2: 19 mmol/L — AB (ref 22–32)
pCO2 arterial: 40.4 mmHg (ref 32.0–48.0)
pH, Arterial: 7.248 — ABNORMAL LOW (ref 7.350–7.450)

## 2018-04-16 LAB — CBC
HEMATOCRIT: 49.1 % (ref 39.0–52.0)
Hemoglobin: 15.8 g/dL (ref 13.0–17.0)
MCH: 30 pg (ref 26.0–34.0)
MCHC: 32.2 g/dL (ref 30.0–36.0)
MCV: 93.2 fL (ref 78.0–100.0)
PLATELETS: 154 10*3/uL (ref 150–400)
RBC: 5.27 MIL/uL (ref 4.22–5.81)
RDW: 12.4 % (ref 11.5–15.5)
WBC: 30.1 10*3/uL — AB (ref 4.0–10.5)

## 2018-04-16 LAB — HIV ANTIBODY (ROUTINE TESTING W REFLEX): HIV Screen 4th Generation wRfx: NONREACTIVE

## 2018-04-16 LAB — COMPREHENSIVE METABOLIC PANEL
ALT: 28 U/L (ref 17–63)
AST: 82 U/L — AB (ref 15–41)
Albumin: 3.3 g/dL — ABNORMAL LOW (ref 3.5–5.0)
Alkaline Phosphatase: 65 U/L (ref 38–126)
Anion gap: 14 (ref 5–15)
BILIRUBIN TOTAL: 1.1 mg/dL (ref 0.3–1.2)
BUN: 16 mg/dL (ref 6–20)
CHLORIDE: 105 mmol/L (ref 101–111)
CO2: 21 mmol/L — ABNORMAL LOW (ref 22–32)
CREATININE: 1.97 mg/dL — AB (ref 0.61–1.24)
Calcium: 7.8 mg/dL — ABNORMAL LOW (ref 8.9–10.3)
GFR calc Af Amer: 47 mL/min — ABNORMAL LOW (ref >60)
GFR, EST NON AFRICAN AMERICAN: 41 mL/min — AB (ref >60)
GLUCOSE: 157 mg/dL — AB (ref 65–99)
POTASSIUM: 4.3 mmol/L (ref 3.5–5.1)
Sodium: 140 mmol/L (ref 135–145)
TOTAL PROTEIN: 6 g/dL — AB (ref 6.5–8.1)

## 2018-04-16 LAB — LACTIC ACID, PLASMA: Lactic Acid, Venous: 3.3 mmol/L (ref 0.5–1.9)

## 2018-04-16 LAB — PREPARE RBC (CROSSMATCH)

## 2018-04-16 LAB — TRIGLYCERIDES: Triglycerides: 216 mg/dL — ABNORMAL HIGH (ref <150)

## 2018-04-16 MED ORDER — SODIUM CHLORIDE 3 % IV SOLN
INTRAVENOUS | Status: DC
  Administered 2018-04-16 – 2018-04-17 (×6): 50 mL/h via INTRAVENOUS
  Filled 2018-04-16 (×8): qty 500

## 2018-04-16 MED ORDER — CHLORHEXIDINE GLUCONATE 0.12% ORAL RINSE (MEDLINE KIT)
15.0000 mL | Freq: Two times a day (BID) | OROMUCOSAL | Status: DC
  Administered 2018-04-16 – 2018-04-29 (×28): 15 mL via OROMUCOSAL

## 2018-04-16 MED ORDER — SODIUM CHLORIDE 0.9% FLUSH: 10.0000 mL | INTRAVENOUS | Status: DC | PRN

## 2018-04-16 MED ORDER — ALBUMIN HUMAN 5 % IV SOLN
25.0000 g | Freq: Once | INTRAVENOUS | Status: AC
  Administered 2018-04-16: 25 g via INTRAVENOUS
  Filled 2018-04-16: qty 500

## 2018-04-16 MED ORDER — ALBUMIN HUMAN 5 % IV SOLN
12.5000 g | Freq: Once | INTRAVENOUS | Status: AC
  Administered 2018-04-16: 12.5 g via INTRAVENOUS
  Filled 2018-04-16: qty 500

## 2018-04-16 MED ORDER — MUPIROCIN 2 % EX OINT
1.0000 "application " | TOPICAL_OINTMENT | Freq: Two times a day (BID) | CUTANEOUS | Status: AC
  Administered 2018-04-16 – 2018-04-17 (×4): 1 via NASAL
  Filled 2018-04-16: qty 22

## 2018-04-16 MED ORDER — ALBUMIN HUMAN 5 % IV SOLN
12.5000 g | Freq: Once | INTRAVENOUS | Status: AC
  Administered 2018-04-16: 12.5 g via INTRAVENOUS

## 2018-04-16 MED ORDER — ORAL CARE MOUTH RINSE
15.0000 mL | OROMUCOSAL | Status: DC
  Administered 2018-04-16 – 2018-04-29 (×133): 15 mL via OROMUCOSAL

## 2018-04-16 MED ORDER — CHLORHEXIDINE GLUCONATE CLOTH 2 % EX PADS: 6.0000 | MEDICATED_PAD | Freq: Every day | CUTANEOUS | Status: DC

## 2018-04-16 MED ORDER — SODIUM CHLORIDE 0.9 % IV SOLN: Freq: Once | INTRAVENOUS | Status: DC

## 2018-04-16 MED ORDER — PHENYLEPHRINE HCL-NACL 10-0.9 MG/250ML-% IV SOLN
0.0000 ug/min | INTRAVENOUS | Status: DC
  Administered 2018-04-16: 20 ug/min via INTRAVENOUS
  Filled 2018-04-16 (×2): qty 250

## 2018-04-16 MED ORDER — SODIUM CHLORIDE 0.9% FLUSH
10.0000 mL | Freq: Two times a day (BID) | INTRAVENOUS | Status: DC
  Administered 2018-04-16: 30 mL
  Administered 2018-04-18 – 2018-04-25 (×4): 10 mL
  Administered 2018-04-25: 40 mL
  Administered 2018-04-26 – 2018-04-29 (×4): 10 mL

## 2018-04-16 MED ORDER — SODIUM CHLORIDE 0.9 % IV SOLN
0.0000 ug/min | INTRAVENOUS | Status: DC
  Administered 2018-04-16 – 2018-04-17 (×2): 40 ug/min via INTRAVENOUS
  Administered 2018-04-17: 50 ug/min via INTRAVENOUS
  Filled 2018-04-16: qty 40
  Filled 2018-04-16 (×2): qty 4
  Filled 2018-04-16: qty 40
  Filled 2018-04-16 (×2): qty 4

## 2018-04-16 NOTE — Progress Notes (Signed)
Notified Dr. Donell BeersByerly of need for central line access in order to continue 3% gtt (per protocol). PICC ordered.

## 2018-04-16 NOTE — Progress Notes (Signed)
   04/16/18 0100  Clinical Encounter Type  Visited With Family;Health care provider  Visit Type Follow-up  Spiritual Encounters  Spiritual Needs Emotional  Stress Factors  Family Stress Factors Major life changes   Was able to let mom and dad back to see patient.  Have escorted family to 4N to wait in the waiting room until patient is settled.  Will follow and support as needed. Chaplain Agustin CreeNewton Bettyanne Dittman

## 2018-04-16 NOTE — Progress Notes (Signed)
Follow up - Trauma and Critical Care  Patient Details:    Cameron Krause is an 42 y.o. male.  Lines/tubes : Airway 8 mm (Active)  Secured at (cm) 26 cm 04/16/2018  8:33 AM  Measured From Lips 04/16/2018  8:33 AM  Secured Location Center 04/16/2018  8:33 AM  Secured By [employer] 04/16/2018  8:33 AM  Tube Holder Repositioned Yes 04/16/2018  8:33 AM  Cuff Pressure (cm H2O) 26 cm H2O 04/16/2018  3:43 AM  Site Condition Dry 04/16/2018  8:33 AM     NG/OG Tube Orogastric 18 Fr. Center mouth Aucultation Measured external length of tube (Active)  Site Assessment Clean;Dry;Intact 04/16/2018  1:00 AM  Status Suction-low intermittent 04/10/2018  9:10 PM     Urethral Catheter [name]  Temperature probe 16 Fr. (Active)  Indication for Insertion or Continuance of Catheter Unstable critical patients (first 24-48 hours) 04/16/2018  1:00 AM  Site Assessment Clean;Intact 04/16/2018  1:00 AM  Catheter Maintenance Bag below level of bladder;Catheter secured;Drainage bag/tubing not touching floor;No dependent loops;Insertion date on drainage bag;Seal intact;Bag emptied prior to transport 04/16/2018  1:00 AM  Collection Container Standard drainage bag 04/16/2018  1:00 AM  Securement Method Securing device (Describe) 04/16/2018  1:00 AM  Urinary Catheter Interventions Unclamped 04/16/2018  1:00 AM  Output (mL) 60 mL 04/16/2018 10:00 AM     ICP/Ventriculostomy ICP only via fiberoptic sensor-microsensor Right Temporal region (Active)  Drain Status Capped 04/16/2018  1:00 AM  Status To pressure monitoring 04/16/2018  1:00 AM  Site Assessment Dry;Clean 04/16/2018  1:00 AM  Dressing Status Clean;Dry;Intact 04/16/2018  1:00 AM  Dressing Intervention Other (Comment) 04/16/2018  1:00 AM    Microbiology/Sepsis markers: No results found for this or any previous visit.  Anti-infectives:  Anti-infectives (From admission, onward)   Start     Dose/Rate Route Frequency Ordered Stop   04/16/18 0730  piperacillin-tazobactam (ZOSYN) IVPB  3.375 g     3.375 g 12.5 mL/hr over 240 Minutes Intravenous Every 8 hours 04/09/2018 2304     04/03/2018 2315  piperacillin-tazobactam (ZOSYN) IVPB 3.375 g     3.375 g 100 mL/hr over 30 Minutes Intravenous  Once 03/17/2018 2303 04/04/2018 2349   03/23/2018 2130  ceFAZolin (ANCEF) IVPB 2g/100 mL premix     2 g 200 mL/hr over 30 Minutes Intravenous  Once 04/06/2018 2129 03/17/2018 2230      Best Practice/Protocols:  VTE Prophylaxis: Mechanical Continous Sedation  Consults: Treatment Team:  Shirlean Kelly, MD    Events:  Subjective:    Overnight Issues: ICP monitor placed.  PEEP increased due to hypoxia from aspiration.    Objective:  Vital signs for last 24 hours: Temp:  [97.7 F (36.5 C)-102.6 F (39.2 C)] 102.6 F (39.2 C) (06/01 0635) Pulse Rate:  [38-108] 80 (06/01 1030) Resp:  [12-40] 25 (06/01 1030) BP: (75-158)/(48-111) 83/60 (06/01 1030) SpO2:  [83 %-100 %] 92 % (06/01 1030) FiO2 (%):  [100 %] 100 % (06/01 0833) Weight:  [113.4 kg (250 lb)-129.9 kg (286 lb 6 oz)] 129.9 kg (286 lb 6 oz) (06/01 0100)  Hemodynamic parameters for last 24 hours:  sl hypotensive.    Intake/Output from previous day: 05/31 0701 - 06/01 0700 In: 3133.4 [I.V.:2983.4; IV Piggyback:150] Out: 995 [Urine:995]  Intake/Output this shift: Total I/O In: -  Out: 60 [Urine:60]  Vent settings for last 24 hours: Vent Mode: PRVC FiO2 (%):  [100 %] 100 % Set Rate:  [18 bmp] 18 bmp Vt Set:  [  620 mL] 620 mL PEEP:  [12 cmH20-14 cmH20] 14 cmH20 Plateau Pressure:  [25 cmH20-26 cmH20] 26 cmH20  Physical Exam:  General: unresponsive Neuro: non responsive HEENT/Neck: no JVD and c collar in place Resp: coarse bilaterally CVS: regular rate GI: soft, nontender, BS WNL, no r/g Skin: no rash Extremities: + tr edema bolt in place, no erythema.    Results for orders placed or performed during the hospital encounter of 05-12-18 (from the past 24 hour(s))  Prepare fresh frozen plasma     Status: None    Collection Time: 05/12/18  9:03 PM  Result Value Ref Range   Unit Number [id number]    Blood Component Type THW PLS APHR    Unit division 00    Status of Unit REL FROM [facility]    Unit tag comment VERBAL ORDERS PER DR STEINL    Transfusion Status OK TO TRANSFUSE    Unit Number [id number]    Blood Component Type THW PLS APHR    Unit division A0    Status of Unit REL FROM [facility]    Unit tag comment VERBAL ORDERS PER DR STEINL    Transfusion Status      OK TO TRANSFUSE Performed at [facility], [address], [place], [place] [address]   CDS serology     Status: None   Collection Time: 12-May-2018  9:05 PM  Result Value Ref Range   CDS serology specimen      SPECIMEN WILL BE HELD FOR 14 DAYS IF TESTING IS REQUIRED  Comprehensive metabolic panel     Status: Abnormal   Collection Time: May 12, 2018  9:05 PM  Result Value Ref Range   Sodium 139 135 - 145 mmol/L   Potassium 3.9 3.5 - 5.1 mmol/L   Chloride 105 101 - 111 mmol/L   CO2 23 22 - 32 mmol/L   Glucose, Bld 159 (H) 65 - 99 mg/dL   BUN 14 6 - 20 mg/dL   Creatinine, Ser 1.32 (H) 0.61 - 1.24 mg/dL   Calcium 8.7 (L) 8.9 - 10.3 mg/dL   Total Protein 6.9 6.5 - 8.1 g/dL   Albumin 4.0 3.5 - 5.0 g/dL   AST 60 (H) 15 - 41 U/L   ALT 30 17 - 63 U/L   Alkaline Phosphatase 76 38 - 126 U/L   Total Bilirubin 1.1 0.3 - 1.2 mg/dL   GFR calc non Af Amer 58 (L) >60 mL/min   GFR calc Af Amer >60 >60 mL/min   Anion gap 11 5 - 15  CBC     Status: Abnormal   Collection Time: May 12, 2018  9:05 PM  Result Value Ref Range   WBC 18.8 (H) 4.0 - 10.5 K/uL   RBC 5.11 4.22 - 5.81 MIL/uL   Hemoglobin 15.4 13.0 - 17.0 g/dL   HCT 44.0 10.2 - 72.5 %   MCV 90.4 78.0 - 100.0 fL   MCH 30.1 26.0 - 34.0 pg   MCHC 33.3 30.0 - 36.0 g/dL   RDW 36.6 44.0 - 34.7 %   Platelets 152 150 - 400 K/uL  Ethanol     Status: None   Collection Time: 2018-05-12  9:05 PM  Result Value Ref Range   Alcohol, Ethyl (B) <10 <10 mg/dL  Protime-INR     Status: None    Collection Time: 12-May-2018  9:05 PM  Result Value Ref Range   Prothrombin Time 15.2 11.4 - 15.2 seconds   INR 1.21   Type and screen  Ordered by PROVIDER DEFAULT     Status: None (Preliminary result)   Collection Time: 04/10/2018  9:05 PM  Result Value Ref Range   ABO/RH(D) B NEG    Antibody Screen NEG    Sample Expiration 04/18/2018    Unit Number [id number]    Blood Component Type RED CELLS,LR    Unit division 00    Status of Unit REL [facility]    Unit tag comment VERBAL ORDERS PER DR STEINL    Transfusion Status OK TO TRANSFUSE    Crossmatch Result NOT NEEDED    Unit Number [id number]    Blood Component Type RBC LR PHER1    Unit division 00    [facility]    Unit tag comment VERBAL ORDERS PER DR STEINL    Transfusion Status OK TO TRANSFUSE    Crossmatch Result NOT NEEDED    Unit Number [id number]    Blood Component Type RED CELLS,LR    Unit division 00    Status of Unit ISSUED    Transfusion Status OK TO TRANSFUSE    Crossmatch Result      Compatible Performed at [facility], [address] [place], [place] [address]   ABO/Rh     Status: None   Collection Time: 03/29/2018  9:05 PM  Result Value Ref Range   ABO/RH(D)      B NEG Performed at [facility], [address] [place], [place] [address]   I-Stat Chem 8, ED     Status: Abnormal   Collection Time: 03/29/2018  9:21 PM  Result Value Ref Range   Sodium 140 135 - 145 mmol/L   Potassium 3.5 3.5 - 5.1 mmol/L   Chloride 104 101 - 111 mmol/L   BUN 16 6 - 20 mg/dL   Creatinine, Ser 1.021.30 (H) 0.61 - 1.24 mg/dL   Glucose, Bld 725159 (H) 65 - 99 mg/dL   Calcium, Ion 3.661.02 (L) 1.15 - 1.40 mmol/L   TCO2 23 22 - 32 mmol/L   Hemoglobin 16.3 13.0 - 17.0 g/dL   HCT 44.048.0 34.739.0 - 42.552.0 %  I-Stat CG4 Lactic Acid, ED     Status: Abnormal   Collection Time: 03/30/2018  9:26 PM  Result Value Ref Range   Lactic Acid, Venous 4.36 (HH) 0.5 - 1.9 mmol/L  I-Stat arterial blood gas, ED     Status:  Abnormal   Collection Time: 03/24/2018 10:14 PM  Result Value Ref Range   pH, Arterial 7.332 (L) 7.350 - 7.450   pCO2 arterial 44.8 32.0 - 48.0 mmHg   pO2, Arterial 92.0 83.0 - 108.0 mmHg   Bicarbonate 23.8 20.0 - 28.0 mmol/L   TCO2 25 22 - 32 mmol/L   O2 Saturation 97.0 %   Acid-base deficit 2.0 0.0 - 2.0 mmol/L   Patient temperature 98.6 F    Collection site RADIAL, ALLEN'S TEST ACCEPTABLE    Drawn by RT    Sample type ARTERIAL   Urinalysis, Routine w reflex microscopic     Status: Abnormal   Collection Time: 03/30/2018 10:39 PM  Result Value Ref Range   Color, Urine YELLOW YELLOW   APPearance CLEAR CLEAR   Specific Gravity, Urine >1.046 (H) 1.005 - 1.030   pH 6.0 5.0 - 8.0   Glucose, UA NEGATIVE NEGATIVE mg/dL   Hgb urine dipstick SMALL (A) NEGATIVE   Bilirubin Urine NEGATIVE NEGATIVE   Ketones, ur NEGATIVE NEGATIVE mg/dL   Protein, ur 30 (  A) NEGATIVE mg/dL   Nitrite NEGATIVE NEGATIVE   Leukocytes, UA NEGATIVE NEGATIVE   WBC, UA 0-5 0 - 5 WBC/hpf   Bacteria, UA NONE SEEN NONE SEEN   Squamous Epithelial / LPF 0-5 0 - 5   Mucus PRESENT    Hyaline Casts, UA PRESENT    Granular Casts, UA PRESENT   Lactic acid, plasma     Status: Abnormal   Collection Time: 03/28/2018 10:45 PM  Result Value Ref Range   Lactic Acid, Venous 3.3 (HH) 0.5 - 1.9 mmol/L  Triglycerides     Status: Abnormal   Collection Time: 03/24/2018 11:11 PM  Result Value Ref Range   Triglycerides 216 (H) <150 mg/dL  I-STAT 3, arterial blood gas (G3+)     Status: Abnormal   Collection Time: 04/16/18  2:32 AM  Result Value Ref Range   pH, Arterial 7.248 (L) 7.350 - 7.450   pCO2 arterial 40.4 32.0 - 48.0 mmHg   pO2, Arterial 68.0 (L) 83.0 - 108.0 mmHg   Bicarbonate 17.7 (L) 20.0 - 28.0 mmol/L   TCO2 19 (L) 22 - 32 mmol/L   O2 Saturation 90.0 %   Acid-base deficit 9.0 (H) 0.0 - 2.0 mmol/L   Patient temperature 98.5 F    Collection site RADIAL, ALLEN'S TEST ACCEPTABLE    Drawn by RT    Sample type ARTERIAL    Comprehensive metabolic panel     Status: Abnormal   Collection Time: 04/16/18  3:14 AM  Result Value Ref Range   Sodium 140 135 - 145 mmol/L   Potassium 4.3 3.5 - 5.1 mmol/L   Chloride 105 101 - 111 mmol/L   CO2 21 (L) 22 - 32 mmol/L   Glucose, Bld 157 (H) 65 - 99 mg/dL   BUN 16 6 - 20 mg/dL   Creatinine, Ser 4.09 (H) 0.61 - 1.24 mg/dL   Calcium 7.8 (L) 8.9 - 10.3 mg/dL   Total Protein 6.0 (L) 6.5 - 8.1 g/dL   Albumin 3.3 (L) 3.5 - 5.0 g/dL   AST 82 (H) 15 - 41 U/L   ALT 28 17 - 63 U/L   Alkaline Phosphatase 65 38 - 126 U/L   Total Bilirubin 1.1 0.3 - 1.2 mg/dL   GFR calc non Af Amer 41 (L) >60 mL/min   GFR calc Af Amer 47 (L) >60 mL/min   Anion gap 14 5 - 15  CBC     Status: Abnormal   Collection Time: 04/16/18  3:14 AM  Result Value Ref Range   WBC 30.1 (H) 4.0 - 10.5 K/uL   RBC 5.27 4.22 - 5.81 MIL/uL   Hemoglobin 15.8 13.0 - 17.0 g/dL   HCT 81.1 91.4 - 78.2 %   MCV 93.2 78.0 - 100.0 fL   MCH 30.0 26.0 - 34.0 pg   MCHC 32.2 30.0 - 36.0 g/dL   RDW 95.6 21.3 - 08.6 %   Platelets 154 150 - 400 K/uL  Prepare RBC     Status: None   Collection Time: 04/16/18  4:20 AM  Result Value Ref Range   Order Confirmation      ORDER PROCESSED BY BLOOD BANK Performed at [facility], [address] [place], [place] [address]      Assessment/Plan:   NEURO  Trauma-CNS:  depressed level of consciousness, intracranial injury and increased intracranial pressure, intraparenchymal hemorrhage, SDH. 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  Subglottic suction to assist with evacuation of blood.  Wean later if tolerated.    CARDIO  hypotension   Plan: support with volume and neosynephrine.    RENAL  acute kidney injury   Plan: volume  GI  No obvious injuries   Plan: Hold on feeds  today. If he begins to improve in the next day or so can start tube feeds.    ID  Leukocytosis, aspiration pneumonia   Plan: Zosyn.    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  Will continue to monitor and treat ICPs, support CPP.  NWB to LUE.     LOS: 1 day   Additional comments:I reviewed the patient's new clinical lab test results. cbc, cmet and I reviewed the patients new imaging test results. CXR  Critical Care Total Time*: 30 Minutes  Almond Lint 04/16/2018  *Care during the described time interval was provided by me and/or other providers on the critical care team.  I have reviewed this patient's available data, including medical history, events of note, physical examination and test results as part of my evaluation.

## 2018-04-16 NOTE — Progress Notes (Signed)
Peripherally Inserted Central Catheter/Midline Placement  The IV Nurse has discussed with the patient and/or persons authorized to consent for the patient, the purpose of this procedure and the potential benefits and risks involved with this procedure.  The benefits include less needle sticks, lab draws from the catheter, and the patient may be discharged home with the catheter. Risks include, but not limited to, infection, bleeding, blood clot (thrombus formation), and puncture of an artery; nerve damage and irregular heartbeat and possibility to perform a PICC exchange if needed/ordered by physician.  Alternatives to this procedure were also discussed.  Bard Power PICC patient education guide, fact sheet on infection prevention and patient information card has been provided to patient /or left at bedside.  Consent signed by SO, obtained by [facility].  PICC/Midline Placement Documentation  PICC Triple Lumen 04/16/18 PICC Left Cephalic 49 cm 0 cm (Active)  Indication for Insertion or Continuance of Line Vasoactive infusions;Prolonged intravenous therapies;Head or chest injuries (Tracheotomy, burns, open chest wounds);Limited venous access - need for IV therapy >5 days (PICC only) 04/16/2018  2:07 PM  Exposed Catheter (cm) 0 cm 04/16/2018  2:07 PM  Site Assessment Clean;Dry;Intact 04/16/2018  2:07 PM  Lumen #1 Status Flushed;Saline locked;Blood return noted 04/16/2018  2:07 PM  Lumen #2 Status Flushed;Saline locked;Blood return noted 04/16/2018  2:07 PM  Lumen #3 Status Flushed;Saline locked;Blood return noted 04/16/2018  2:07 PM  Dressing Type Transparent 04/16/2018  2:07 PM  Dressing Status Clean;Dry;Intact;Antimicrobial disc in place 04/16/2018  2:07 PM  Line Care Connections checked and tightened 04/16/2018  2:07 PM  Line Adjustment (NICU/IV Team Only) No 04/16/2018  2:07 PM  Dressing Intervention New dressing 04/16/2018  2:07 PM  Dressing Change Due 04/23/18 04/16/2018  2:07 PM       Elliot Dallyiggs, Clarence Dunsmore  Wright 04/16/2018, 2:07 PM

## 2018-04-16 NOTE — Op Note (Signed)
12:40 AM  PATIENT:  Cameron Krause  42 y.o. male  PRE-OPERATIVE DIAGNOSIS: Closed head injury, GCS 6, hemorrhagic cerebral contusion, skull fractures  POST-OPERATIVE DIAGNOSIS:  Closed head injury, GCS 6, hemorrhagic cerebral contusion, skull fractures  PROCEDURE: Placement of right frontal cranial pressure monitoring bolt  SURGEON: Shirlean Kellyobert Nudelman, MD  ANESTHESIA:   local  DICTATION: [facility] bay, the right frontal region was shaved with electric clippers, and prepped with Betadine solution and draped in a sterile fashion.  The scalp was infiltrated with 1% Xylocaine local anesthetic with epinephrine.  A 3 mm right frontal incision was made in the right mid pupillary line, adjacent to the coronal suture.  A twist drill hole was made, and the [place] bolt was secured to the skull.  The dura was punctured.  The pressure transducer was zeroed to air.  It was then passed into the right frontal lobe.  An opening pressure of 29 mmHg was noted, with a good waveform.  The system was dressed with sterile gauze and tape.

## 2018-04-16 NOTE — Progress Notes (Signed)
Patient transported from ED to 4N18 without any complications. 

## 2018-04-16 NOTE — Progress Notes (Signed)
Patient Axillary temp 101.3*F before blood administration. Wyatt notified. He advised no action to be taken to decrease the temp before the blood administration. Ice packs applied. Will continue to monitor

## 2018-04-16 NOTE — ED Notes (Signed)
Belongings were given to family which included clothes, ID Drivers License and wallet.

## 2018-04-16 NOTE — Progress Notes (Signed)
Wyatt notified of patient systolic BP at 78 with sedation being turned off. Verbal order given to administer of Albumin and 1unit PRBC. Will continue to monitor
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
## 2018-04-16 NOTE — ED Notes (Addendum)
This RN and Shanda BumpsJessica RN 4N assisted [facility] with placement of ICP bolt for monitoring of ICP.  Initial pressures were 29, patient tolerated procedure well.  Vitals stable throughout.

## 2018-04-16 DEATH — deceased

## 2018-04-17 ENCOUNTER — Inpatient Hospital Stay (HOSPITAL_COMMUNITY): Payer: 59

## 2018-04-17 LAB — TYPE AND SCREEN
ABO/RH(D): B NEG
ANTIBODY SCREEN: NEGATIVE
UNIT DIVISION: 0
Unit division: 0
Unit division: 0

## 2018-04-17 LAB — BPAM RBC
Blood Product Expiration Date: 201906062359
Blood Product Expiration Date: 201906182359
Blood Product Expiration Date: 201906292359
ISSUE DATE / TIME: 201906010500
ISSUE DATE / TIME: 201906020438
ISSUE DATE / TIME: 201905312104
Unit Type and Rh: 1700
Unit Type and Rh: 9500
Unit Type and Rh: 9500

## 2018-04-17 LAB — POCT I-STAT 3, ART BLOOD GAS (G3+)
Acid-base deficit: 6 mmol/L — ABNORMAL HIGH (ref 0.0–2.0)
BICARBONATE: 20.3 mmol/L (ref 20.0–28.0)
O2 SAT: 85 %
PCO2 ART: 42.5 mmHg (ref 32.0–48.0)
PH ART: 7.287 — AB (ref 7.350–7.450)
PO2 ART: 56 mmHg — AB (ref 83.0–108.0)
TCO2: 22 mmol/L (ref 22–32)

## 2018-04-17 MED ORDER — IPRATROPIUM-ALBUTEROL 0.5-2.5 (3) MG/3ML IN SOLN
3.0000 mL | Freq: Four times a day (QID) | RESPIRATORY_TRACT | Status: DC
  Administered 2018-04-17 – 2018-04-19 (×10): 3 mL via RESPIRATORY_TRACT
  Filled 2018-04-17 (×10): qty 3

## 2018-04-17 MED ORDER — CHLORHEXIDINE GLUCONATE CLOTH 2 % EX PADS
6.0000 | MEDICATED_PAD | Freq: Every day | CUTANEOUS | Status: DC
  Administered 2018-04-18 – 2018-04-28 (×11): 6 via TOPICAL
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
## 2018-04-17 NOTE — Progress Notes (Signed)
RT transported pt to and from CT without event. 
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
## 2018-04-18 ENCOUNTER — Inpatient Hospital Stay (HOSPITAL_COMMUNITY): Payer: 59

## 2018-04-18 LAB — POCT I-STAT 3, ART BLOOD GAS (G3+)
Acid-base deficit: 4 mmol/L — ABNORMAL HIGH (ref 0.0–2.0)
Acid-base deficit: 1 mmol/L (ref 0.0–2.0)
BICARBONATE: 22.8 mmol/L (ref 20.0–28.0)
Bicarbonate: 25.4 mmol/L (ref 20.0–28.0)
O2 SAT: 98 %
O2 Saturation: 100 %
PCO2 ART: 51.8 mmHg — AB (ref 32.0–48.0)
PH ART: 7.299 — AB (ref 7.350–7.450)
PO2 ART: 228 mmHg — AB (ref 83.0–108.0)
Patient temperature: 98.6
TCO2: 24 mmol/L (ref 22–32)
TCO2: 27 mmol/L (ref 22–32)
pCO2 arterial: 50.3 mmHg — ABNORMAL HIGH (ref 32.0–48.0)
pH, Arterial: 7.263 — ABNORMAL LOW (ref 7.350–7.450)
pO2, Arterial: 128 mmHg — ABNORMAL HIGH (ref 83.0–108.0)

## 2018-04-18 LAB — POCT I-STAT, CHEM 8
BUN: 13 mg/dL (ref 6–20)
BUN: 15 mg/dL (ref 6–20)
CALCIUM ION: 1.13 mmol/L — AB (ref 1.15–1.40)
Calcium, Ion: 1.1 mmol/L — ABNORMAL LOW (ref 1.15–1.40)
Chloride: 118 mmol/L — ABNORMAL HIGH (ref 101–111)
Chloride: 120 mmol/L — ABNORMAL HIGH (ref 101–111)
Creatinine, Ser: 1.1 mg/dL (ref 0.61–1.24)
Creatinine, Ser: 1.2 mg/dL (ref 0.61–1.24)
GLUCOSE: 106 mg/dL — AB (ref 65–99)
Glucose, Bld: 136 mg/dL — ABNORMAL HIGH (ref 65–99)
HCT: 23 % — ABNORMAL LOW (ref 39.0–52.0)
HEMATOCRIT: 22 % — AB (ref 39.0–52.0)
HEMOGLOBIN: 7.8 g/dL — AB (ref 13.0–17.0)
Hemoglobin: 7.5 g/dL — ABNORMAL LOW (ref 13.0–17.0)
POTASSIUM: 2.8 mmol/L — AB (ref 3.5–5.1)
Potassium: 3 mmol/L — ABNORMAL LOW (ref 3.5–5.1)
SODIUM: 152 mmol/L — AB (ref 135–145)
Sodium: 153 mmol/L — ABNORMAL HIGH (ref 135–145)
TCO2: 22 mmol/L (ref 22–32)
TCO2: 22 mmol/L (ref 22–32)

## 2018-04-18 LAB — BASIC METABOLIC PANEL
ANION GAP: 5 (ref 5–15)
ANION GAP: 5 (ref 5–15)
BUN: 19 mg/dL (ref 6–20)
BUN: 14 mg/dL (ref 6–20)
BUN: 17 mg/dL (ref 6–20)
CO2: 24 mmol/L (ref 22–32)
CO2: 26 mmol/L (ref 22–32)
CO2: 26 mmol/L (ref 22–32)
CREATININE: 1.15 mg/dL (ref 0.61–1.24)
Calcium: 6.9 mg/dL — ABNORMAL LOW (ref 8.9–10.3)
Calcium: 8 mg/dL — ABNORMAL LOW (ref 8.9–10.3)
Calcium: 8.2 mg/dL — ABNORMAL LOW (ref 8.9–10.3)
Chloride: 128 mmol/L — ABNORMAL HIGH (ref 101–111)
Chloride: 129 mmol/L — ABNORMAL HIGH (ref 101–111)
Creatinine, Ser: 1.33 mg/dL — ABNORMAL HIGH (ref 0.61–1.24)
Creatinine, Ser: 1.42 mg/dL — ABNORMAL HIGH (ref 0.61–1.24)
GFR calc Af Amer: >60 mL/min (ref >60)
GFR calc Af Amer: >60 mL/min (ref >60)
GFR calc Af Amer: >60 mL/min (ref >60)
GFR calc non Af Amer: >60 mL/min (ref >60)
GLUCOSE: 117 mg/dL — AB (ref 65–99)
GLUCOSE: 124 mg/dL — AB (ref 65–99)
Glucose, Bld: 121 mg/dL — ABNORMAL HIGH (ref 65–99)
POTASSIUM: 3.6 mmol/L (ref 3.5–5.1)
POTASSIUM: 3.7 mmol/L (ref 3.5–5.1)
POTASSIUM: 3.4 mmol/L — AB (ref 3.5–5.1)
SODIUM: 160 mmol/L — AB (ref 135–145)
Sodium: 180 mmol/L (ref 135–145)
Sodium: 159 mmol/L — ABNORMAL HIGH (ref 135–145)

## 2018-04-18 LAB — HEMOGLOBIN AND HEMATOCRIT, BLOOD
HEMATOCRIT: 27.1 % — AB (ref 39.0–52.0)
HEMOGLOBIN: 8.5 g/dL — AB (ref 13.0–17.0)

## 2018-04-18 LAB — CBC
HCT: 29.7 % — ABNORMAL LOW (ref 39.0–52.0)
HEMOGLOBIN: 9.4 g/dL — AB (ref 13.0–17.0)
MCH: 30.1 pg (ref 26.0–34.0)
MCHC: 31.6 g/dL (ref 30.0–36.0)
MCV: 95.2 fL (ref 78.0–100.0)
PLATELETS: 89 10*3/uL — AB (ref 150–400)
RBC: 3.12 MIL/uL — AB (ref 4.22–5.81)
RDW: 15.2 % (ref 11.5–15.5)
WBC: 18.6 10*3/uL — ABNORMAL HIGH (ref 4.0–10.5)

## 2018-04-18 LAB — SODIUM
Sodium: 151 mmol/L — ABNORMAL HIGH (ref 135–145)
Sodium: 151 mmol/L — ABNORMAL HIGH (ref 135–145)

## 2018-04-18 LAB — BLOOD PRODUCT ORDER (VERBAL) VERIFICATION

## 2018-04-18 MED ORDER — POTASSIUM CHLORIDE 10 MEQ/50ML IV SOLN
10.0000 meq | INTRAVENOUS | Status: AC
  Administered 2018-04-19 (×3): 10 meq via INTRAVENOUS
  Filled 2018-04-18 (×3): qty 50

## 2018-04-18 MED ORDER — IPRATROPIUM-ALBUTEROL 0.5-2.5 (3) MG/3ML IN SOLN
RESPIRATORY_TRACT | Status: AC
  Administered 2018-04-18: 04:00:00
  Filled 2018-04-18: qty 3

## 2018-04-18 MED ORDER — FREE WATER
100.0000 mL | Freq: Three times a day (TID) | Status: DC
  Administered 2018-04-18 – 2018-04-19 (×4): 100 mL

## 2018-04-18 MED ORDER — SODIUM CHLORIDE 0.9 % IV BOLUS
500.0000 mL | Freq: Once | INTRAVENOUS | Status: AC
  Administered 2018-04-18: 500 mL via INTRAVENOUS

## 2018-04-18 MED ORDER — PIVOT 1.5 CAL PO LIQD
1000.0000 mL | ORAL | Status: DC
  Administered 2018-04-18: 1000 mL

## 2018-04-18 MED ORDER — SODIUM CHLORIDE 0.45 % IV SOLN
INTRAVENOUS | Status: DC
  Administered 2018-04-18 – 2018-04-20 (×5): via INTRAVENOUS

## 2018-04-18 MED ORDER — PANTOPRAZOLE SODIUM 40 MG PO PACK
40.0000 mg | PACK | Freq: Every day | ORAL | Status: DC
  Administered 2018-04-18 – 2018-04-22 (×5): 40 mg
  Filled 2018-04-18 (×5): qty 20

## 2018-04-18 NOTE — Progress Notes (Signed)
RN notified from lab about a critical sodium level of 180 and chloride greater than 130. 3% NaCl was stopped. Dr. Janee Mornhompson, on call trauma MD, notified at 0630 and new orders were given and immediately carried out. Will continue to monitor.
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
## 2018-04-19 ENCOUNTER — Inpatient Hospital Stay (HOSPITAL_COMMUNITY): Payer: 59

## 2018-04-19 LAB — BASIC METABOLIC PANEL
Anion gap: 4 — ABNORMAL LOW (ref 5–15)
BUN: 15 mg/dL (ref 6–20)
CALCIUM: 7.5 mg/dL — AB (ref 8.9–10.3)
CO2: 25 mmol/L (ref 22–32)
CREATININE: 1.29 mg/dL — AB (ref 0.61–1.24)
Chloride: 127 mmol/L — ABNORMAL HIGH (ref 101–111)
GFR calc Af Amer: >60 mL/min (ref >60)
GLUCOSE: 102 mg/dL — AB (ref 65–99)
Potassium: 2.8 mmol/L — ABNORMAL LOW (ref 3.5–5.1)
SODIUM: 156 mmol/L — AB (ref 135–145)

## 2018-04-19 LAB — SODIUM
SODIUM: 174 mmol/L — AB (ref 135–145)
SODIUM: 174 mmol/L — AB (ref 135–145)
Sodium: 173 mmol/L (ref 135–145)

## 2018-04-19 LAB — GLUCOSE, CAPILLARY: GLUCOSE-CAPILLARY: 117 mg/dL — AB (ref 65–99)

## 2018-04-19 LAB — TRIGLYCERIDES: Triglycerides: 594 mg/dL — ABNORMAL HIGH (ref <150)

## 2018-04-19 MED ORDER — PRO-STAT SUGAR FREE PO LIQD
60.0000 mL | Freq: Four times a day (QID) | ORAL | Status: DC
  Administered 2018-04-19 – 2018-04-23 (×17): 60 mL
  Filled 2018-04-19 (×17): qty 60

## 2018-04-19 MED ORDER — MIDAZOLAM HCL 2 MG/2ML IJ SOLN: 2.0000 mg | INTRAMUSCULAR | Status: DC | PRN

## 2018-04-19 MED ORDER — IPRATROPIUM-ALBUTEROL 0.5-2.5 (3) MG/3ML IN SOLN
3.0000 mL | Freq: Four times a day (QID) | RESPIRATORY_TRACT | Status: DC
  Administered 2018-04-20 – 2018-04-29 (×39): 3 mL via RESPIRATORY_TRACT
  Filled 2018-04-19 (×40): qty 3

## 2018-04-19 MED ORDER — MIDAZOLAM BOLUS VIA INFUSION
1.0000 mg | INTRAVENOUS | Status: DC | PRN
  Filled 2018-04-19: qty 2

## 2018-04-19 MED ORDER — POTASSIUM CHLORIDE 20 MEQ/15ML (10%) PO SOLN
40.0000 meq | Freq: Once | ORAL | Status: AC
  Administered 2018-04-19: 40 meq via ORAL
  Filled 2018-04-19: qty 30

## 2018-04-19 MED ORDER — FREE WATER
250.0000 mL | Status: DC
  Administered 2018-04-19 – 2018-04-20 (×5): 250 mL

## 2018-04-19 MED ORDER — ACETAMINOPHEN 160 MG/5ML PO SOLN
650.0000 mg | Freq: Four times a day (QID) | ORAL | Status: DC | PRN
  Administered 2018-04-19 – 2018-04-29 (×17): 650 mg
  Filled 2018-04-19 (×16): qty 20.3

## 2018-04-19 MED ORDER — SODIUM CHLORIDE 0.9 % IV BOLUS
1000.0000 mL | Freq: Once | INTRAVENOUS | Status: AC
  Administered 2018-04-19: 1000 mL via INTRAVENOUS

## 2018-04-19 MED ORDER — SODIUM CHLORIDE 0.9 % IV SOLN
0.0000 mg/h | INTRAVENOUS | Status: DC
  Administered 2018-04-19 (×2): 2 mg/h via INTRAVENOUS
  Administered 2018-04-20: 6 mg/h via INTRAVENOUS
  Filled 2018-04-19 (×4): qty 10

## 2018-04-19 MED ORDER — FREE WATER: 200.0000 mL | Status: DC

## 2018-04-19 MED ORDER — FREE WATER
200.0000 mL | Freq: Three times a day (TID) | Status: DC
  Administered 2018-04-19: 200 mL

## 2018-04-19 MED ORDER — PIVOT 1.5 CAL PO LIQD
1000.0000 mL | ORAL | Status: DC
  Administered 2018-04-19 – 2018-04-22 (×4): 1000 mL

## 2018-04-19 MED ORDER — ADULT MULTIVITAMIN LIQUID CH
15.0000 mL | Freq: Every day | ORAL | Status: DC
  Administered 2018-04-19 – 2018-04-23 (×5): 15 mL
  Filled 2018-04-19 (×5): qty 15

## 2018-04-19 NOTE — Progress Notes (Signed)
Dr. Janee Mornhompson made aware patient febrile 102.1 ax. Orders received and carried out. Nursing to continue to monitor.
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
## 2018-04-19 NOTE — Progress Notes (Signed)
CRITICAL VALUE ALERT  Critical Value:  Na+ 173  Date & Time Notied:  04/19/2018 1421  Provider Notified: Dr. Janee Mornhompson  Orders Received/Actions taken: Orders received to repeat lab draw.
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
## 2018-04-19 NOTE — Progress Notes (Signed)
CRITICAL VALUE ALERT  Critical Value:  Na+ 174  Date & Time Notied:  04/19/2018 1900  Provider Notified: Dr. Andrey CampanileWilson  Orders Received/Actions taken: Physician to review pt's chart and enter new orders.
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
## 2018-04-20 ENCOUNTER — Inpatient Hospital Stay (HOSPITAL_COMMUNITY): Payer: 59

## 2018-04-20 LAB — POCT I-STAT, CHEM 8
BUN: 17 mg/dL (ref 6–20)
CALCIUM ION: 1.27 mmol/L (ref 1.15–1.40)
Chloride: 129 mmol/L — ABNORMAL HIGH (ref 101–111)
Creatinine, Ser: 1.2 mg/dL (ref 0.61–1.24)
GLUCOSE: 94 mg/dL (ref 65–99)
HCT: 24 % — ABNORMAL LOW (ref 39.0–52.0)
Hemoglobin: 8.2 g/dL — ABNORMAL LOW (ref 13.0–17.0)
Potassium: 2.1 mmol/L — CL (ref 3.5–5.1)
SODIUM: 163 mmol/L — AB (ref 135–145)
TCO2: 23 mmol/L (ref 22–32)

## 2018-04-20 LAB — COMPREHENSIVE METABOLIC PANEL
ALK PHOS: 61 U/L (ref 38–126)
ALT: 24 U/L (ref 17–63)
AST: 53 U/L — ABNORMAL HIGH (ref 15–41)
Albumin: 2.3 g/dL — ABNORMAL LOW (ref 3.5–5.0)
BUN: 22 mg/dL — ABNORMAL HIGH (ref 6–20)
CALCIUM: 8.3 mg/dL — AB (ref 8.9–10.3)
CO2: 28 mmol/L (ref 22–32)
CREATININE: 1.59 mg/dL — AB (ref 0.61–1.24)
GFR, EST NON AFRICAN AMERICAN: 53 mL/min — AB (ref >60)
Glucose, Bld: 135 mg/dL — ABNORMAL HIGH (ref 65–99)
Potassium: 3.5 mmol/L (ref 3.5–5.1)
SODIUM: 171 mmol/L — AB (ref 135–145)
Total Bilirubin: 2.1 mg/dL — ABNORMAL HIGH (ref 0.3–1.2)
Total Protein: 5.8 g/dL — ABNORMAL LOW (ref 6.5–8.1)

## 2018-04-20 LAB — BASIC METABOLIC PANEL
Anion gap: 3 — ABNORMAL LOW (ref 5–15)
BUN: 17 mg/dL (ref 6–20)
BUN: 23 mg/dL — ABNORMAL HIGH (ref 6–20)
CALCIUM: 6.7 mg/dL — AB (ref 8.9–10.3)
CALCIUM: 8.2 mg/dL — AB (ref 8.9–10.3)
CO2: 23 mmol/L (ref 22–32)
CO2: 29 mmol/L (ref 22–32)
CREATININE: 1.25 mg/dL — AB (ref 0.61–1.24)
CREATININE: 1.6 mg/dL — AB (ref 0.61–1.24)
Chloride: 129 mmol/L — ABNORMAL HIGH (ref 101–111)
Chloride: >130 mmol/L (ref 101–111)
GFR calc non Af Amer: >60 mL/min (ref >60)
GFR, EST NON AFRICAN AMERICAN: 52 mL/min — AB (ref >60)
Glucose, Bld: 110 mg/dL — ABNORMAL HIGH (ref 65–99)
Glucose, Bld: 145 mg/dL — ABNORMAL HIGH (ref 65–99)
Potassium: <2 mmol/L — CL (ref 3.5–5.1)
Potassium: 4.1 mmol/L (ref 3.5–5.1)
SODIUM: 155 mmol/L — AB (ref 135–145)
SODIUM: 174 mmol/L — AB (ref 135–145)

## 2018-04-20 LAB — CBC
HCT: 25 % — ABNORMAL LOW (ref 39.0–52.0)
Hemoglobin: 7.5 g/dL — ABNORMAL LOW (ref 13.0–17.0)
MCH: 29.4 pg (ref 26.0–34.0)
MCHC: 30 g/dL (ref 30.0–36.0)
MCV: 98 fL (ref 78.0–100.0)
PLATELETS: 91 10*3/uL — AB (ref 150–400)
RBC: 2.55 MIL/uL — ABNORMAL LOW (ref 4.22–5.81)
RDW: 15.4 % (ref 11.5–15.5)
WBC: 11.7 10*3/uL — ABNORMAL HIGH (ref 4.0–10.5)

## 2018-04-20 LAB — MAGNESIUM: MAGNESIUM: 1.7 mg/dL (ref 1.7–2.4)

## 2018-04-20 LAB — SODIUM: SODIUM: 154 mmol/L — AB (ref 135–145)

## 2018-04-20 MED ORDER — POTASSIUM CHLORIDE 20 MEQ/15ML (10%) PO SOLN
40.0000 meq | Freq: Once | ORAL | Status: AC
  Administered 2018-04-20: 40 meq via ORAL
  Filled 2018-04-20: qty 30

## 2018-04-20 MED ORDER — FREE WATER
500.0000 mL | Status: DC
  Administered 2018-04-20 – 2018-04-23 (×17): 500 mL

## 2018-04-20 MED ORDER — POTASSIUM CHLORIDE 20 MEQ/15ML (10%) PO SOLN
80.0000 meq | Freq: Once | ORAL | Status: AC
  Administered 2018-04-20: 80 meq via ORAL
  Filled 2018-04-20: qty 60

## 2018-04-20 MED ORDER — POTASSIUM CHLORIDE 10 MEQ/50ML IV SOLN
10.0000 meq | INTRAVENOUS | Status: AC | PRN
  Administered 2018-04-20 (×3): 10 meq via INTRAVENOUS
  Filled 2018-04-20 (×3): qty 50

## 2018-04-20 MED ORDER — POTASSIUM CHLORIDE 10 MEQ/100ML IV SOLN
10.0000 meq | INTRAVENOUS | Status: DC
  Filled 2018-04-20: qty 100

## 2018-04-20 MED ORDER — DEXTROSE 5 % IV SOLN
INTRAVENOUS | Status: AC
Start: 2018-04-20 — End: 2018-04-25
  Administered 2018-04-20 – 2018-04-25 (×10): via INTRAVENOUS

## 2018-04-20 MED ORDER — POTASSIUM CHLORIDE 10 MEQ/50ML IV SOLN
10.0000 meq | INTRAVENOUS | Status: AC
  Administered 2018-04-20 (×2): 10 meq via INTRAVENOUS
  Filled 2018-04-20 (×3): qty 50

## 2018-04-20 MED ORDER — DEXTROSE-NACL 5-0.2 % IV SOLN
INTRAVENOUS | Status: DC
  Administered 2018-04-20: 16:00:00 via INTRAVENOUS
  Filled 2018-04-20 (×7): qty 1000

## 2018-04-20 MED ORDER — POTASSIUM CHLORIDE 10 MEQ/50ML IV SOLN
10.0000 meq | INTRAVENOUS | Status: DC
  Administered 2018-04-20: 10 meq via INTRAVENOUS
  Filled 2018-04-20 (×5): qty 50

## 2018-04-20 MED ORDER — FREE WATER
400.0000 mL | Status: DC
  Administered 2018-04-20: 400 mL

## 2018-04-20 MED ORDER — MAGNESIUM SULFATE 2 GM/50ML IV SOLN
2.0000 g | Freq: Once | INTRAVENOUS | Status: AC
  Administered 2018-04-20: 2 g via INTRAVENOUS
  Filled 2018-04-20: qty 50

## 2018-04-20 NOTE — Progress Notes (Signed)
Na 171 FW deficit 10.9L Change IF to 0.2%NS @150  Increase FW flush to 400mL q4h  Serial sodiums
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
## 2018-04-21 ENCOUNTER — Inpatient Hospital Stay (HOSPITAL_COMMUNITY): Payer: 59

## 2018-04-21 DIAGNOSIS — J9601 Acute respiratory failure with hypoxia: Secondary | ICD-10-CM

## 2018-04-21 DIAGNOSIS — I619 Nontraumatic intracerebral hemorrhage, unspecified: Secondary | ICD-10-CM

## 2018-04-21 DIAGNOSIS — J80 Acute respiratory distress syndrome: Secondary | ICD-10-CM

## 2018-04-21 DIAGNOSIS — R918 Other nonspecific abnormal finding of lung field: Secondary | ICD-10-CM

## 2018-04-21 LAB — BLOOD GAS, ARTERIAL
ACID-BASE EXCESS: 4.7 mmol/L — AB (ref 0.0–2.0)
Acid-Base Excess: 4 mmol/L — ABNORMAL HIGH (ref 0.0–2.0)
BICARBONATE: 31.9 mmol/L — AB (ref 20.0–28.0)
Bicarbonate: 29.4 mmol/L — ABNORMAL HIGH (ref 20.0–28.0)
DRAWN BY: 511471
DRAWN BY: 365271
FIO2: 100
FIO2: 100
LHR: 20 {breaths}/min
O2 SAT: 85.5 %
O2 Saturation: 88.5 %
PATIENT TEMPERATURE: 100.4
PATIENT TEMPERATURE: 100.5
PCO2 ART: 59.6 mmHg — AB (ref 32.0–48.0)
PCO2 ART: 85.2 mmHg — AB (ref 32.0–48.0)
PEEP: 14 cmH2O
PH ART: 7.32 — AB (ref 7.350–7.450)
PO2 ART: 57.1 mmHg — AB (ref 83.0–108.0)
PO2 ART: 68.8 mmHg — AB (ref 83.0–108.0)
PRESSURE CONTROL: 18 cmH2O
RATE: 20 resp/min
pH, Arterial: 7.206 — ABNORMAL LOW (ref 7.350–7.450)

## 2018-04-21 LAB — POCT I-STAT 3, ART BLOOD GAS (G3+)
ACID-BASE EXCESS: 4 mmol/L — AB (ref 0.0–2.0)
ACID-BASE EXCESS: 3 mmol/L — AB (ref 0.0–2.0)
BICARBONATE: 29.5 mmol/L — AB (ref 20.0–28.0)
Bicarbonate: 30.5 mmol/L — ABNORMAL HIGH (ref 20.0–28.0)
O2 Saturation: 95 %
O2 Saturation: 95 %
PCO2 ART: 64.7 mmHg — AB (ref 32.0–48.0)
PH ART: 7.285 — AB (ref 7.350–7.450)
PH ART: 7.307 — AB (ref 7.350–7.450)
PO2 ART: 89 mmHg (ref 83.0–108.0)
Patient temperature: 37.8
TCO2: 32 mmol/L (ref 22–32)
TCO2: 31 mmol/L (ref 22–32)
pCO2 arterial: 59.8 mmHg — ABNORMAL HIGH (ref 32.0–48.0)
pO2, Arterial: 90 mmHg (ref 83.0–108.0)

## 2018-04-21 LAB — CBC
HEMATOCRIT: 26.5 % — AB (ref 39.0–52.0)
HEMOGLOBIN: 7.9 g/dL — AB (ref 13.0–17.0)
MCH: 29.5 pg (ref 26.0–34.0)
MCHC: 29.8 g/dL — ABNORMAL LOW (ref 30.0–36.0)
MCV: 98.9 fL (ref 78.0–100.0)
Platelets: 89 10*3/uL — ABNORMAL LOW (ref 150–400)
RBC: 2.68 MIL/uL — ABNORMAL LOW (ref 4.22–5.81)
RDW: 15.9 % — ABNORMAL HIGH (ref 11.5–15.5)
WBC: 12.2 10*3/uL — AB (ref 4.0–10.5)

## 2018-04-21 LAB — BASIC METABOLIC PANEL
BUN: 26 mg/dL — AB (ref 6–20)
CALCIUM: 8 mg/dL — AB (ref 8.9–10.3)
CO2: 30 mmol/L (ref 22–32)
CREATININE: 1.82 mg/dL — AB (ref 0.61–1.24)
GFR calc Af Amer: 52 mL/min — ABNORMAL LOW (ref >60)
GFR calc non Af Amer: 45 mL/min — ABNORMAL LOW (ref >60)
Glucose, Bld: 141 mg/dL — ABNORMAL HIGH (ref 65–99)
Potassium: 4 mmol/L (ref 3.5–5.1)
SODIUM: 170 mmol/L — AB (ref 135–145)

## 2018-04-21 LAB — MAGNESIUM: Magnesium: 2.4 mg/dL (ref 1.7–2.4)

## 2018-04-21 LAB — SODIUM
SODIUM: 169 mmol/L — AB (ref 135–145)
Sodium: 167 mmol/L (ref 135–145)
Sodium: 166 mmol/L (ref 135–145)

## 2018-04-21 MED ORDER — FENTANYL CITRATE (PF) 100 MCG/2ML IJ SOLN: 100.0000 ug | Freq: Once | INTRAMUSCULAR | Status: DC

## 2018-04-21 MED ORDER — SODIUM CHLORIDE 0.9 % IV SOLN
2.0000 mg/h | INTRAVENOUS | Status: DC
  Filled 2018-04-21: qty 10

## 2018-04-21 MED ORDER — CISATRACURIUM BOLUS VIA INFUSION
0.1000 mg/kg | Freq: Once | INTRAVENOUS | Status: AC
  Administered 2018-04-21: 13 mg via INTRAVENOUS
  Filled 2018-04-21: qty 13

## 2018-04-21 MED ORDER — MIDAZOLAM HCL 50 MG/10ML IJ SOLN
1.0000 mg/h | INTRAMUSCULAR | Status: DC
  Administered 2018-04-21 – 2018-04-23 (×10): 8 mg/h via INTRAVENOUS
  Filled 2018-04-21 (×9): qty 10

## 2018-04-21 MED ORDER — MIDAZOLAM HCL 2 MG/2ML IJ SOLN: 2.0000 mg | Freq: Once | INTRAMUSCULAR | Status: DC | PRN

## 2018-04-21 MED ORDER — SODIUM CHLORIDE 0.9 % IV SOLN
3.0000 ug/kg/min | INTRAVENOUS | Status: DC
  Administered 2018-04-21 – 2018-04-23 (×7): 3 ug/kg/min via INTRAVENOUS
  Administered 2018-04-24: 5 ug/kg/min via INTRAVENOUS
  Administered 2018-04-24 (×2): 3 ug/kg/min via INTRAVENOUS
  Administered 2018-04-25 – 2018-04-28 (×13): 5 ug/kg/min via INTRAVENOUS
  Filled 2018-04-21 (×24): qty 20

## 2018-04-21 MED ORDER — CISATRACURIUM BESYLATE 20 MG/10ML IV SOLN
10.0000 mg | Freq: Once | INTRAVENOUS | Status: AC
  Administered 2018-04-21: 10 mg via INTRAVENOUS
  Filled 2018-04-21 (×2): qty 10

## 2018-04-21 MED ORDER — SODIUM CHLORIDE 0.9 % IV SOLN
2500.0000 mg | Freq: Once | INTRAVENOUS | Status: AC
  Administered 2018-04-21: 2500 mg via INTRAVENOUS
  Filled 2018-04-21: qty 2500

## 2018-04-21 MED ORDER — VANCOMYCIN HCL 10 G IV SOLR
1500.0000 mg | INTRAVENOUS | Status: DC
  Administered 2018-04-22 – 2018-04-23 (×2): 1500 mg via INTRAVENOUS
  Filled 2018-04-21 (×3): qty 1500

## 2018-04-21 MED ORDER — FENTANYL CITRATE (PF) 100 MCG/2ML IJ SOLN: 100.0000 ug | Freq: Once | INTRAMUSCULAR | Status: DC | PRN

## 2018-04-21 MED ORDER — ARTIFICIAL TEARS OPHTHALMIC OINT
1.0000 "application " | TOPICAL_OINTMENT | Freq: Three times a day (TID) | OPHTHALMIC | Status: DC
  Administered 2018-04-21 – 2018-04-28 (×21): 1 via OPHTHALMIC
  Filled 2018-04-21: qty 3.5

## 2018-04-21 MED ORDER — MIDAZOLAM HCL 2 MG/2ML IJ SOLN: 2.0000 mg | Freq: Once | INTRAMUSCULAR | Status: DC

## 2018-04-21 MED ORDER — FENTANYL 2500MCG IN NS 250ML (10MCG/ML) PREMIX INFUSION
100.0000 ug/h | INTRAVENOUS | Status: DC
  Administered 2018-04-21 – 2018-04-22 (×5): 275 ug/h via INTRAVENOUS
  Administered 2018-04-23: 300 ug/h via INTRAVENOUS
  Administered 2018-04-23: 275 ug/h via INTRAVENOUS
  Administered 2018-04-23 – 2018-04-27 (×13): 300 ug/h via INTRAVENOUS
  Administered 2018-04-28: 175 ug/h via INTRAVENOUS
  Administered 2018-04-28: 300 ug/h via INTRAVENOUS
  Filled 2018-04-21 (×21): qty 250
  Filled 2018-04-21: qty 500

## 2018-04-21 MED ORDER — FENTANYL BOLUS VIA INFUSION
50.0000 ug | INTRAVENOUS | Status: DC | PRN
  Administered 2018-04-23: 50 ug via INTRAVENOUS
  Filled 2018-04-21: qty 50

## 2018-04-21 NOTE — Progress Notes (Signed)
eLink Physician-Brief Progress Note Patient Name: Daryll Brodimothy Bias DOB: Dec 27, 1975 MRN: [id number]   Date of Service  04/21/2018  HPI/Events of Note  Saturation improved with Nimbex bolus but worsened again with saturations in the 89 % range when Nimbex wore off.  eICU Interventions  Nimbex infusion per protocol with BIS monitoring, increase PEEP to 16 mm Hg until saturations stabilize above 92 %        Chevy Virgo U Ayomide Purdy 04/21/2018, 5:20 AM

## 2018-04-21 NOTE — Progress Notes (Signed)
After attempting to get a train of four on the patient at 80 mA I was unable to get any twitches. Patients BIS was reading in the 30s and the goal is 40-60. BIS placement is correct and site and electrodes were checked. TOF electrodes were moved from ulnar to facial nerve area. Two other nurses and myself attempted to get a TOF on patient with twitches and was unable to. Dr Molli KnockYacoub was notified and ordered to lower nimbex gtt. Nimbex gtt was lowered below the normal rate of 673mcg/kg/min. Patient is remaining compliant on the vent with nimbex gtt being lowered. Will continue to monitor BIS and TOF.

## 2018-04-21 NOTE — Procedures (Signed)
Arterial Catheter Insertion Procedure Note Cameron Krause [id number] 1975/12/17  Procedure: Insertion of Arterial Catheter  Indications: Frequent blood sampling  Procedure Details Consent: Risks of procedure as well as the alternatives and risks of each were explained to the (patient/caregiver).  Consent for procedure obtained. Time Out: Verified patient identification, verified procedure, site/side was marked, verified correct patient position, special equipment/implants available, medications/allergies/relevent history reviewed, required imaging and test results available.  Performed  Maximum sterile technique was used including antiseptics, cap, gloves, gown, hand hygiene, mask, sheet and sterile cloth. Skin prep: Chlorhexidine; local anesthetic administered 20 gauge catheter was inserted into right radial artery using the Seldinger technique. ULTRASOUND GUIDANCE USED: NO Evaluation Blood flow good; BP tracing good. Complications: No apparent complications.   Cameron Krause M 04/21/2018
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
## 2018-04-21 NOTE — Progress Notes (Signed)
Spoke with on call CCM MD in regards to not being able to obtain twitches when assessing TOF's. MD instructed RN to first check the batteries on TOF machine and make sure they are functioning correctly. Next to change electrode placement and make sure that the area is clean and dry. Lastly if still unable to obtain twitches, cut off paralytic temporarily and reassess twitches at 15 and 30 minutes. Then reinitiate paralytic gtt at 321mcg/kg/min. Will execute these orders and continue to monitor. Delfino Lovettichie Raelene Trew, RN, BSN 04/21/2018 7:55 PM

## 2018-04-21 NOTE — Progress Notes (Addendum)
Pharmacy Antibiotic Note  Cameron Krause is a 42 y.o. male admitted on 12/17/2017 with pneumonia.  Pharmacy has been consulted for vancomycin dosing. Also continues on Zosyn day #7 per MD. SCr up to 1.82, normalized CrCl~55. WBC up to 12.2, tmax/24h 101.8  Plan: Vancomycin 2500mg  IV x 1; then 1500mg  IV q24h Zosyn 3.375g IV q8h (4h infusion) Monitor clinical progress, c/s, renal function F/u de-escalation plan/LOT, vancomycin trough as indicated   Height: 6\' 2"  (188 cm) Weight: 286 lb 6 oz (129.9 kg) IBW/kg (Calculated) : 82.2  Temp (24hrs), Avg:100.9 F (38.3 C), Min:100 F (37.8 C), Max:101.8 F (38.8 C)  Recent Labs  Lab 04-12-18 2105  04-12-18 2126 04-12-18 2245 04/16/18 0314 04/18/18 0521  04/20/18 0014 04/20/18 0429 04/20/18 1339 04/20/18 1845 04/21/18 0328 04/21/18 0553  WBC 18.8*  --   --   --  30.1* 18.6*  --   --  11.7*  --   --  12.2*  --   CREATININE 1.47*   < >  --   --  1.97* 1.15   < > 1.20 1.25* 1.59* 1.60*  --  1.82*  LATICACIDVEN  --   --  4.36* 3.3*  --   --   --   --   --   --   --   --   --    < > = values in this interval not displayed.    Estimated Creatinine Clearance: 77.3 mL/min (A) (by C-G formula based on SCr of 1.82 mg/dL (H)).    No Known Allergies  Antimicrobials this admission: 6/6 vanc >>  5/31 zosyn >> 5/31 cefazolin x1  Dose adjustments this admission:  Microbiology results: 6/6 BCx:  6/6 UCx:   6/4 Sputum: few yeast; pending   Babs BertinHaley Emalyn Schou, PharmD, BCPS Clinical Pharmacist Clinical phone for 04/21/2018 until 3:30pm: [phone] If after 3:30pm, please call main pharmacy at: [phone] 04/21/2018 12:59 PM
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
## 2018-04-21 NOTE — Progress Notes (Signed)
Called on call CCM MD back in regards to pausing paralytic and checking for twitches at 15 and 30 minute intervals. Still unable to obtain twitches at ulnar and posterior tibial nerve sites. Within 30 minutes of paralytic being paused pt O2 sats dropped from 94 to 90. On call MD made aware, instructed RN to turn paralytic back on and continue to monitor. RN will execute orders. Delfino Lovettichie Paul Trettin, RN, BSN 04/21/2018 9:49 PM

## 2018-04-22 ENCOUNTER — Inpatient Hospital Stay (HOSPITAL_COMMUNITY): Payer: 59

## 2018-04-22 DIAGNOSIS — E870 Hyperosmolality and hypernatremia: Secondary | ICD-10-CM

## 2018-04-22 LAB — CBC WITH DIFFERENTIAL/PLATELET
Basophils Absolute: 0 10*3/uL (ref 0.0–0.1)
Basophils Relative: 0 %
EOS PCT: 3 %
Eosinophils Absolute: 0.4 10*3/uL (ref 0.0–0.7)
HEMATOCRIT: 28.3 % — AB (ref 39.0–52.0)
HEMOGLOBIN: 8.4 g/dL — AB (ref 13.0–17.0)
LYMPHS ABS: 1.3 10*3/uL (ref 0.7–4.0)
Lymphocytes Relative: 9 %
MCH: 29.7 pg (ref 26.0–34.0)
MCHC: 29.7 g/dL — AB (ref 30.0–36.0)
MCV: 100 fL (ref 78.0–100.0)
MONO ABS: 0.9 10*3/uL (ref 0.1–1.0)
MONOS PCT: 6 %
NEUTROS ABS: 11.7 10*3/uL — AB (ref 1.7–7.7)
Neutrophils Relative %: 82 %
Platelets: 107 10*3/uL — ABNORMAL LOW (ref 150–400)
RBC: 2.83 MIL/uL — AB (ref 4.22–5.81)
RDW: 15.6 % — AB (ref 11.5–15.5)
WBC: 14.3 10*3/uL — AB (ref 4.0–10.5)

## 2018-04-22 LAB — BLOOD GAS, ARTERIAL
ACID-BASE EXCESS: 6.3 mmol/L — AB (ref 0.0–2.0)
Bicarbonate: 32 mmol/L — ABNORMAL HIGH (ref 20.0–28.0)
DRAWN BY: 245131
FIO2: 80
LHR: 36 {breaths}/min
O2 Saturation: 92.8 %
PATIENT TEMPERATURE: 98.6
PEEP: 16 cmH2O
PH ART: 7.329 — AB (ref 7.350–7.450)
PO2 ART: 68.3 mmHg — AB (ref 83.0–108.0)
Pressure control: 18 cmH2O
pCO2 arterial: 62.7 mmHg — ABNORMAL HIGH (ref 32.0–48.0)

## 2018-04-22 LAB — BASIC METABOLIC PANEL
Anion gap: 6 (ref 5–15)
BUN: 30 mg/dL — AB (ref 6–20)
CALCIUM: 7.8 mg/dL — AB (ref 8.9–10.3)
CHLORIDE: 123 mmol/L — AB (ref 101–111)
CO2: 31 mmol/L (ref 22–32)
CREATININE: 1.64 mg/dL — AB (ref 0.61–1.24)
GFR calc Af Amer: 59 mL/min — ABNORMAL LOW (ref >60)
GFR calc non Af Amer: 51 mL/min — ABNORMAL LOW (ref >60)
Glucose, Bld: 147 mg/dL — ABNORMAL HIGH (ref 65–99)
Potassium: 3.8 mmol/L (ref 3.5–5.1)
Sodium: 160 mmol/L — ABNORMAL HIGH (ref 135–145)

## 2018-04-22 LAB — CULTURE, RESPIRATORY W GRAM STAIN: Special Requests: NORMAL

## 2018-04-22 LAB — CULTURE, RESPIRATORY

## 2018-04-22 LAB — PHOSPHORUS: Phosphorus: 3.4 mg/dL (ref 2.5–4.6)

## 2018-04-22 LAB — MAGNESIUM: Magnesium: 2.6 mg/dL — ABNORMAL HIGH (ref 1.7–2.4)

## 2018-04-22 LAB — GLUCOSE, CAPILLARY: Glucose-Capillary: 153 mg/dL — ABNORMAL HIGH (ref 65–99)

## 2018-04-22 MED ORDER — FUROSEMIDE 10 MG/ML IJ SOLN
40.0000 mg | Freq: Four times a day (QID) | INTRAMUSCULAR | Status: AC
  Administered 2018-04-22 – 2018-04-23 (×3): 40 mg via INTRAVENOUS
  Filled 2018-04-22 (×3): qty 4

## 2018-04-22 MED ORDER — MAGNESIUM HYDROXIDE 400 MG/5ML PO SUSP
30.0000 mL | Freq: Once | ORAL | Status: AC
  Administered 2018-04-22: 30 mL
  Filled 2018-04-22: qty 30

## 2018-04-22 MED ORDER — DOCUSATE SODIUM 50 MG/5ML PO LIQD
100.0000 mg | Freq: Two times a day (BID) | ORAL | Status: AC
  Administered 2018-04-22: 100 mg
  Filled 2018-04-22: qty 10

## 2018-04-22 MED ORDER — INSULIN ASPART 100 UNIT/ML ~~LOC~~ SOLN
0.0000 [IU] | SUBCUTANEOUS | Status: DC
  Administered 2018-04-23 (×2): 2 [IU] via SUBCUTANEOUS
  Administered 2018-04-23: 3 [IU] via SUBCUTANEOUS
  Administered 2018-04-23 (×2): 2 [IU] via SUBCUTANEOUS
  Administered 2018-04-23: 3 [IU] via SUBCUTANEOUS
  Administered 2018-04-24 – 2018-04-27 (×19): 2 [IU] via SUBCUTANEOUS
  Administered 2018-04-27: 3 [IU] via SUBCUTANEOUS
  Administered 2018-04-27: 2 [IU] via SUBCUTANEOUS
  Administered 2018-04-27: 5 [IU] via SUBCUTANEOUS
  Administered 2018-04-27: 3 [IU] via SUBCUTANEOUS
  Administered 2018-04-28: 8 [IU] via SUBCUTANEOUS
  Administered 2018-04-28: 5 [IU] via SUBCUTANEOUS
  Administered 2018-04-28: 8 [IU] via SUBCUTANEOUS
  Administered 2018-04-28: 11 [IU] via SUBCUTANEOUS

## 2018-04-22 NOTE — Progress Notes (Signed)
Vent changes made by Dr. Yacoub 

## 2018-04-22 NOTE — Progress Notes (Signed)
Due to desat, FIO2 increased to 70%, and Peep increased to 14.  MD notified.
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
## 2018-04-23 ENCOUNTER — Inpatient Hospital Stay (HOSPITAL_COMMUNITY): Payer: 59

## 2018-04-23 DIAGNOSIS — J9811 Atelectasis: Secondary | ICD-10-CM

## 2018-04-23 LAB — PHOSPHORUS: Phosphorus: 3.3 mg/dL (ref 2.5–4.6)

## 2018-04-23 LAB — BASIC METABOLIC PANEL
ANION GAP: 6 (ref 5–15)
Anion gap: 7 (ref 5–15)
BUN: 38 mg/dL — AB (ref 6–20)
BUN: 36 mg/dL — ABNORMAL HIGH (ref 6–20)
CHLORIDE: 111 mmol/L (ref 101–111)
CO2: 36 mmol/L — ABNORMAL HIGH (ref 22–32)
CO2: 35 mmol/L — ABNORMAL HIGH (ref 22–32)
Calcium: 8 mg/dL — ABNORMAL LOW (ref 8.9–10.3)
Calcium: 7.8 mg/dL — ABNORMAL LOW (ref 8.9–10.3)
Chloride: 111 mmol/L (ref 101–111)
Creatinine, Ser: 1.86 mg/dL — ABNORMAL HIGH (ref 0.61–1.24)
Creatinine, Ser: 1.71 mg/dL — ABNORMAL HIGH (ref 0.61–1.24)
GFR calc Af Amer: 51 mL/min — ABNORMAL LOW (ref >60)
GFR calc non Af Amer: 48 mL/min — ABNORMAL LOW (ref >60)
GFR, EST AFRICAN AMERICAN: 56 mL/min — AB (ref >60)
GFR, EST NON AFRICAN AMERICAN: 44 mL/min — AB (ref >60)
Glucose, Bld: 156 mg/dL — ABNORMAL HIGH (ref 65–99)
Glucose, Bld: 171 mg/dL — ABNORMAL HIGH (ref 65–99)
POTASSIUM: 3.4 mmol/L — AB (ref 3.5–5.1)
POTASSIUM: 3.7 mmol/L (ref 3.5–5.1)
SODIUM: 154 mmol/L — AB (ref 135–145)
Sodium: 152 mmol/L — ABNORMAL HIGH (ref 135–145)

## 2018-04-23 LAB — POCT I-STAT 3, ART BLOOD GAS (G3+)
ACID-BASE EXCESS: 6 mmol/L — AB (ref 0.0–2.0)
ACID-BASE EXCESS: 7 mmol/L — AB (ref 0.0–2.0)
Acid-Base Excess: 11 mmol/L — ABNORMAL HIGH (ref 0.0–2.0)
Acid-Base Excess: 10 mmol/L — ABNORMAL HIGH (ref 0.0–2.0)
BICARBONATE: 38.2 mmol/L — AB (ref 20.0–28.0)
BICARBONATE: 36.9 mmol/L — AB (ref 20.0–28.0)
Bicarbonate: 34.5 mmol/L — ABNORMAL HIGH (ref 20.0–28.0)
Bicarbonate: 35.3 mmol/L — ABNORMAL HIGH (ref 20.0–28.0)
O2 SAT: 95 %
O2 SAT: 76 %
O2 SAT: 96 %
O2 Saturation: 97 %
PCO2 ART: 75 mmHg — AB (ref 32.0–48.0)
PCO2 ART: 70 mmHg — AB (ref 32.0–48.0)
PH ART: 7.32 — AB (ref 7.350–7.450)
PO2 ART: 90 mmHg (ref 83.0–108.0)
PO2 ART: 106 mmHg (ref 83.0–108.0)
Patient temperature: 100.4
Patient temperature: 38
Patient temperature: 101.7
TCO2: 37 mmol/L — ABNORMAL HIGH (ref 22–32)
TCO2: 38 mmol/L — ABNORMAL HIGH (ref 22–32)
TCO2: 40 mmol/L — AB (ref 22–32)
TCO2: 39 mmol/L — ABNORMAL HIGH (ref 22–32)
pCO2 arterial: 70.7 mmHg (ref 32.0–48.0)
pCO2 arterial: 79.8 mmHg (ref 32.0–48.0)
pH, Arterial: 7.302 — ABNORMAL LOW (ref 7.350–7.450)
pH, Arterial: 7.257 — ABNORMAL LOW (ref 7.350–7.450)
pH, Arterial: 7.338 — ABNORMAL LOW (ref 7.350–7.450)
pO2, Arterial: 51 mmHg — ABNORMAL LOW (ref 83.0–108.0)
pO2, Arterial: 95 mmHg (ref 83.0–108.0)

## 2018-04-23 LAB — PROTIME-INR
INR: 1.31
Prothrombin Time: 16.2 seconds — ABNORMAL HIGH (ref 11.4–15.2)

## 2018-04-23 LAB — CBC
HCT: 27.8 % — ABNORMAL LOW (ref 39.0–52.0)
HEMATOCRIT: 29.6 % — AB (ref 39.0–52.0)
HEMOGLOBIN: 8.7 g/dL — AB (ref 13.0–17.0)
Hemoglobin: 8.3 g/dL — ABNORMAL LOW (ref 13.0–17.0)
MCH: 29.7 pg (ref 26.0–34.0)
MCH: 29.9 pg (ref 26.0–34.0)
MCHC: 29.9 g/dL — ABNORMAL LOW (ref 30.0–36.0)
MCHC: 29.4 g/dL — ABNORMAL LOW (ref 30.0–36.0)
MCV: 99.6 fL (ref 78.0–100.0)
MCV: 101.7 fL — AB (ref 78.0–100.0)
PLATELETS: 118 10*3/uL — AB (ref 150–400)
Platelets: 125 10*3/uL — ABNORMAL LOW (ref 150–400)
RBC: 2.79 MIL/uL — AB (ref 4.22–5.81)
RBC: 2.91 MIL/uL — AB (ref 4.22–5.81)
RDW: 14.9 % (ref 11.5–15.5)
RDW: 14.6 % (ref 11.5–15.5)
WBC: 17.1 10*3/uL — AB (ref 4.0–10.5)
WBC: 19.4 10*3/uL — AB (ref 4.0–10.5)

## 2018-04-23 LAB — GLUCOSE, CAPILLARY
GLUCOSE-CAPILLARY: 144 mg/dL — AB (ref 65–99)
Glucose-Capillary: 122 mg/dL — ABNORMAL HIGH (ref 65–99)
Glucose-Capillary: 128 mg/dL — ABNORMAL HIGH (ref 65–99)
Glucose-Capillary: 131 mg/dL — ABNORMAL HIGH (ref 65–99)
Glucose-Capillary: 135 mg/dL — ABNORMAL HIGH (ref 65–99)
Glucose-Capillary: 153 mg/dL — ABNORMAL HIGH (ref 65–99)

## 2018-04-23 LAB — LACTIC ACID, PLASMA: LACTIC ACID, VENOUS: 1.6 mmol/L (ref 0.5–1.9)

## 2018-04-23 LAB — MAGNESIUM: MAGNESIUM: 2.7 mg/dL — AB (ref 1.7–2.4)

## 2018-04-23 LAB — APTT: APTT: 32 s (ref 24–36)

## 2018-04-23 LAB — MRSA PCR SCREENING: MRSA by PCR: NEGATIVE

## 2018-04-23 MED ORDER — MIDAZOLAM HCL 50 MG/10ML IJ SOLN
0.0000 mg/h | INTRAMUSCULAR | Status: DC
  Administered 2018-04-23: 8 mg/h via INTRAVENOUS
  Administered 2018-04-23 – 2018-04-28 (×11): 10 mg/h via INTRAVENOUS
  Filled 2018-04-23 (×11): qty 20

## 2018-04-23 MED ORDER — ACETAMINOPHEN 650 MG RE SUPP
650.0000 mg | RECTAL | Status: DC | PRN
  Administered 2018-04-23 – 2018-04-27 (×8): 650 mg via RECTAL
  Filled 2018-04-23 (×10): qty 1

## 2018-04-23 MED ORDER — POTASSIUM CHLORIDE 20 MEQ/15ML (10%) PO SOLN
40.0000 meq | Freq: Once | ORAL | Status: AC
  Administered 2018-04-23: 40 meq
  Filled 2018-04-23: qty 30

## 2018-04-23 MED ORDER — PHENYLEPHRINE HCL-NACL 10-0.9 MG/250ML-% IV SOLN
0.0000 ug/min | INTRAVENOUS | Status: DC
  Administered 2018-04-23: 100 ug/min via INTRAVENOUS
  Filled 2018-04-23: qty 250

## 2018-04-23 MED ORDER — DOCUSATE SODIUM 50 MG/5ML PO LIQD
100.0000 mg | Freq: Two times a day (BID) | ORAL | Status: DC
  Administered 2018-04-23 – 2018-04-29 (×4): 100 mg
  Filled 2018-04-23 (×6): qty 10

## 2018-04-23 MED ORDER — FUROSEMIDE 10 MG/ML IJ SOLN
INTRAMUSCULAR | Status: AC
Start: 2018-04-23 — End: 2018-04-23
  Administered 2018-04-23: 80 mg via INTRAVENOUS
  Filled 2018-04-23: qty 8

## 2018-04-23 MED ORDER — FUROSEMIDE 10 MG/ML IJ SOLN
80.0000 mg | Freq: Once | INTRAMUSCULAR | Status: AC
  Administered 2018-04-23: 80 mg via INTRAVENOUS

## 2018-04-23 MED ORDER — SODIUM CHLORIDE 0.9 % IV SOLN: 1.0000 mg/h | INTRAVENOUS | Status: DC
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
## 2018-04-23 NOTE — Progress Notes (Signed)
Dr. Conchita ParisNundkumar called, informed that c-collar was removed in order to prone patient. Verbalized understanding.

## 2018-04-23 NOTE — Progress Notes (Signed)
Pt proned.  Sats improved to 96%.

## 2018-04-23 NOTE — Progress Notes (Signed)
eLink Physician-Brief Progress Note Patient Name: Cameron Krause DOB: January 26, 1976 MRN: [id number]   Date of Service  04/23/2018  HPI/Events of Note  Hypokalemia  eICU Interventions  Potassium replaced     Intervention Category Intermediate Interventions: Electrolyte abnormality - evaluation and management  Zuleima Haser 04/23/2018, 6:09 AM

## 2018-04-23 NOTE — Progress Notes (Signed)
Critical ABG values given to RN and Dr. Vassie LollAlva.    [facility] sats, but leave peep at 18.

## 2018-04-23 NOTE — Progress Notes (Signed)
RT called to pt's room due to desat.  RT bagged patient and increased sat to 97%, placed pt back on vent at 100%, sat dropped to 80%.  RT manually ventilating pt again.  MD will be paged.

## 2018-04-23 NOTE — Progress Notes (Signed)
Results for Daryll BrodTERSON, Aarsh (MRN [id number]) as of 04/23/2018 04:29  Ref. Range 04/23/2018 03:50  Sample type Unknown ARTERIAL  pH, Arterial Latest Ref Range: 7.350 - 7.450  7.302 (L)  pCO2 arterial Latest Ref Range: 32.0 - 48.0 mmHg 70.7 (HH)  pO2, Arterial Latest Ref Range: 83.0 - 108.0 mmHg 90.0  TCO2 Latest Ref Range: 22 - 32 mmol/L 37 (H)  Acid-Base Excess Latest Ref Range: 0.0 - 2.0 mmol/L 6.0 (H)  Bicarbonate Latest Ref Range: 20.0 - 28.0 mmol/L 34.5 (H)  O2 Saturation Latest Units: % 95.0  Patient temperature Unknown 100.4 F  Collection site Unknown RADIAL, ALLEN'S T...    ELink RN, Abby notified of critical high CO2  of  70.7.  No new orders at this time.  Will continue to monitor.

## 2018-04-23 NOTE — Progress Notes (Signed)
Called to bedside emergently for severe desaturation, on my arrival being bagged, with oxygen saturation in the low 80s  You put him back on the respirator increase PEEP to 18 and [facility] tidal volume 490- 550 range, from 6 to 7 cc/kg Chest x-ray obtained stat which seem to suggest right lower lobe atelectasis and diffuse infiltrates otherwise. Lasix 80 mg IV given. He was then turned semi-prone with right lung up and then back localized with moderate secretions obtained, saturations transiently improved to 97% but then gradually dropped down again  Bedside bronchoscopy performed with minimal secretions suctioned out from the right lung. He was then turned prone with improvement in his saturations to 94%. [facility] was called for ECMO and they felt that given his poor neurological prognosis would not be a good candidate.  Communicated with trauma physician at the bedside.  Updated patient's brother in detail Total time spent at the bedside trying all above maneuvers x 90 mins  Cyril Mourningakesh Deegan Valentino MD. FCCP. [place] [facility] Pager [phone] If no response call [phone]   04/23/2018

## 2018-04-23 NOTE — Progress Notes (Signed)
RT called in critical ABG results to Elink.   Ph             7.338 PCo2        70.0 PO2          106 HCO3        36.9 Sp02          97%
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
## 2018-04-23 NOTE — Progress Notes (Signed)
E link called, Dr Vassie LollAlva came to bedside.  Pt manually ventilated with ambu bag.  Sats not improving, several small plugs suctioned out.  CRX ordered.

## 2018-04-23 NOTE — Progress Notes (Signed)
Informed by patients RN that he has had progressively worsening desaturation, and has been evaluated by pulmonary. Underwent bronchoscopy earlier this afternoon, but required prone positioning to maintain adequate oxygenation. Was further informed that he was noted to have potential grey matter oozing from his nose. Maintenance of adequate oxygenation will take precedence in this patient's care. Furthermore, even if this patient did hav e increased intracranial pressure in the prone position, he is being maximally managed medically on sedation and paralytics, blood pressure is normal, and cervical collar removed. At this point, he would certainly not be a candidate for surgical intracranial hypertension management. Would therefore continue with whatever measures are deemed appropriate by pulmonary medicine to maintain adequate oxygenation.

## 2018-04-23 NOTE — Progress Notes (Signed)
At ~ 1530, pt desatted to the low 80s on 100% FiO2 via vent, was manually bagged by RN and RT with brief improvements in oxygenation. BP dropped to the 60s while oxygen continued to decline. Vassie LollAlva, MD paged to bedside. Blood suctioned from mouth and nose, tube feeding stopped and OG placed to ILWS. Stat CXR obtained, MD ordered to put pt in prone position.  At time of note, pt is lying prone satting 97%. Paralytic and sedation remained on during entire event. Pt is in no apparent distress.

## 2018-04-23 NOTE — Progress Notes (Signed)
Seal on foley catheter broken, leak in collection bag and needed to be changed.

## 2018-04-23 NOTE — Procedures (Signed)
Bronchoscopy Procedure Note Daryll Brodimothy Rhett [id number] September 28, 1976  Procedure: Bronchoscopy Indications: Diagnostic evaluation of the airways and Remove secretions  Procedure Details Consent: Risks of procedure as well as the alternatives and risks of each were explained to the (patient/caregiver).  Consent for procedure obtained. Time Out: Verified patient identification, verified procedure, site/side was marked, verified correct patient position, special equipment/implants available, medications/allergies/relevent history reviewed, required imaging and test results available.  Performed  In preparation for procedure, patient was given 100% FiO2 and bronchoscope lubricated. Sedation: versed/fent/ nimbex gtt  Airway entered and the following bronchi were examined: Bronchi.   Secretions suctioned from RLL Bronchoscope removed.  , Patient placed back on 100% FiO2 at conclusion of procedure.    Evaluation Hemodynamic Status: BP stable throughout; O2 sats: stable throughout Patient's Current Condition: unstable Specimens:  None Complications: No apparent complications Patient did tolerate procedure well.   Comer Locketakesh V. Vassie LollAlva 04/23/2018

## 2018-04-24 ENCOUNTER — Inpatient Hospital Stay (HOSPITAL_COMMUNITY): Payer: 59

## 2018-04-24 DIAGNOSIS — R6521 Severe sepsis with septic shock: Secondary | ICD-10-CM

## 2018-04-24 DIAGNOSIS — A419 Sepsis, unspecified organism: Secondary | ICD-10-CM

## 2018-04-24 LAB — POCT I-STAT 3, ART BLOOD GAS (G3+)
Acid-Base Excess: 10 mmol/L — ABNORMAL HIGH (ref 0.0–2.0)
Acid-Base Excess: 11 mmol/L — ABNORMAL HIGH (ref 0.0–2.0)
Acid-Base Excess: 12 mmol/L — ABNORMAL HIGH (ref 0.0–2.0)
Acid-Base Excess: 16 mmol/L — ABNORMAL HIGH (ref 0.0–2.0)
BICARBONATE: 36.5 mmol/L — AB (ref 20.0–28.0)
BICARBONATE: 37.9 mmol/L — AB (ref 20.0–28.0)
Bicarbonate: 36 mmol/L — ABNORMAL HIGH (ref 20.0–28.0)
Bicarbonate: 42.5 mmol/L — ABNORMAL HIGH (ref 20.0–28.0)
O2 SAT: 97 %
O2 SAT: 100 %
O2 Saturation: 98 %
O2 Saturation: 94 %
PCO2 ART: 64.8 mmHg — AB (ref 32.0–48.0)
PCO2 ART: 59.6 mmHg — AB (ref 32.0–48.0)
PCO2 ART: 59.8 mmHg — AB (ref 32.0–48.0)
PH ART: 7.358 (ref 7.350–7.450)
PH ART: 7.38 (ref 7.350–7.450)
PO2 ART: 121 mmHg — AB (ref 83.0–108.0)
PO2 ART: 76 mmHg — AB (ref 83.0–108.0)
Patient temperature: 100.9
Patient temperature: 37.7
TCO2: 38 mmol/L — AB (ref 22–32)
TCO2: 38 mmol/L — AB (ref 22–32)
TCO2: 40 mmol/L — ABNORMAL HIGH (ref 22–32)
TCO2: 45 mmol/L — AB (ref 22–32)
pCO2 arterial: 72.8 mmHg (ref 32.0–48.0)
pH, Arterial: 7.397 (ref 7.350–7.450)
pH, Arterial: 7.416 (ref 7.350–7.450)
pO2, Arterial: 103 mmHg (ref 83.0–108.0)
pO2, Arterial: 242 mmHg — ABNORMAL HIGH (ref 83.0–108.0)

## 2018-04-24 LAB — BASIC METABOLIC PANEL
ANION GAP: 8 (ref 5–15)
BUN: 31 mg/dL — ABNORMAL HIGH (ref 6–20)
CALCIUM: 7.7 mg/dL — AB (ref 8.9–10.3)
CHLORIDE: 106 mmol/L (ref 101–111)
CO2: 36 mmol/L — AB (ref 22–32)
CREATININE: 1.69 mg/dL — AB (ref 0.61–1.24)
GFR calc Af Amer: 57 mL/min — ABNORMAL LOW (ref >60)
GFR calc non Af Amer: 49 mL/min — ABNORMAL LOW (ref >60)
Glucose, Bld: 134 mg/dL — ABNORMAL HIGH (ref 65–99)
Potassium: 3.8 mmol/L (ref 3.5–5.1)
Sodium: 150 mmol/L — ABNORMAL HIGH (ref 135–145)

## 2018-04-24 LAB — CBC
HCT: 25.8 % — ABNORMAL LOW (ref 39.0–52.0)
Hemoglobin: 8 g/dL — ABNORMAL LOW (ref 13.0–17.0)
MCH: 29.9 pg (ref 26.0–34.0)
MCHC: 31 g/dL (ref 30.0–36.0)
MCV: 96.3 fL (ref 78.0–100.0)
PLATELETS: 126 10*3/uL — AB (ref 150–400)
RBC: 2.68 MIL/uL — AB (ref 4.22–5.81)
RDW: 14.1 % (ref 11.5–15.5)
WBC: 25.1 10*3/uL — ABNORMAL HIGH (ref 4.0–10.5)

## 2018-04-24 LAB — GLUCOSE, CAPILLARY
GLUCOSE-CAPILLARY: 146 mg/dL — AB (ref 65–99)
Glucose-Capillary: 125 mg/dL — ABNORMAL HIGH (ref 65–99)
Glucose-Capillary: 114 mg/dL — ABNORMAL HIGH (ref 65–99)
Glucose-Capillary: 125 mg/dL — ABNORMAL HIGH (ref 65–99)
Glucose-Capillary: 138 mg/dL — ABNORMAL HIGH (ref 65–99)

## 2018-04-24 LAB — MAGNESIUM: MAGNESIUM: 2.6 mg/dL — AB (ref 1.7–2.4)

## 2018-04-24 LAB — URINE CULTURE: Culture: NO GROWTH

## 2018-04-24 LAB — PHOSPHORUS: Phosphorus: 2.3 mg/dL — ABNORMAL LOW (ref 2.5–4.6)

## 2018-04-24 MED ORDER — FUROSEMIDE 10 MG/ML IJ SOLN
80.0000 mg | Freq: Once | INTRAMUSCULAR | Status: AC
  Administered 2018-04-24: 80 mg via INTRAVENOUS
  Filled 2018-04-24: qty 8
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
## 2018-04-24 NOTE — Progress Notes (Signed)
RT called in critical ABG results to Dr. Dellie CatholicSommers at 2040.
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
## 2018-04-24 NOTE — Progress Notes (Signed)
This note also relates to the following rows which could not be included: SpO2 - Cannot attach notes to unvalidated device data  Pt's head turned to the left at this time due to proning.

## 2018-04-24 NOTE — Progress Notes (Signed)
Pt rolled to supine position per MD order.  ETT remained stable.  Sats dropped to 87%, pt suctioned and FIO2 increased to 60%, sat improved to 93%.

## 2018-04-24 NOTE — Progress Notes (Signed)
eLink Physician-Brief Progress Note Patient Name: Cameron Krause DOB: 01-27-1976 MRN: [id number]   Date of Service  04/24/2018  HPI/Events of Note  ABG on 60%/PRVC 32/TV 490/P 18 = 7.38/72.8/236. Called d/t pCO2 being a "panic value", however, the pH is normal.   eICU Interventions  Continue present management.      Intervention Category Major Interventions: Acid-Base disturbance - evaluation and management;Respiratory failure - evaluation and management  Lenell AntuSommer,Latresha Yahr Eugene 04/24/2018, 8:47 PM

## 2018-04-25 ENCOUNTER — Inpatient Hospital Stay (HOSPITAL_COMMUNITY): Payer: 59

## 2018-04-25 DIAGNOSIS — J690 Pneumonitis due to inhalation of food and vomit: Secondary | ICD-10-CM

## 2018-04-25 DIAGNOSIS — J9601 Acute respiratory failure with hypoxia: Secondary | ICD-10-CM

## 2018-04-25 DIAGNOSIS — J80 Acute respiratory distress syndrome: Secondary | ICD-10-CM

## 2018-04-25 LAB — CBC
HCT: 24.3 % — ABNORMAL LOW (ref 39.0–52.0)
Hemoglobin: 7.6 g/dL — ABNORMAL LOW (ref 13.0–17.0)
MCH: 30.2 pg (ref 26.0–34.0)
MCHC: 31.3 g/dL (ref 30.0–36.0)
MCV: 96.4 fL (ref 78.0–100.0)
PLATELETS: 169 10*3/uL (ref 150–400)
RBC: 2.52 MIL/uL — AB (ref 4.22–5.81)
RDW: 13.8 % (ref 11.5–15.5)
WBC: 21.3 10*3/uL — AB (ref 4.0–10.5)

## 2018-04-25 LAB — GLUCOSE, CAPILLARY
GLUCOSE-CAPILLARY: 124 mg/dL — AB (ref 65–99)
GLUCOSE-CAPILLARY: 127 mg/dL — AB (ref 65–99)
Glucose-Capillary: 117 mg/dL — ABNORMAL HIGH (ref 65–99)
Glucose-Capillary: 121 mg/dL — ABNORMAL HIGH (ref 65–99)
Glucose-Capillary: 143 mg/dL — ABNORMAL HIGH (ref 65–99)
Glucose-Capillary: 138 mg/dL — ABNORMAL HIGH (ref 65–99)

## 2018-04-25 LAB — POCT I-STAT 3, ART BLOOD GAS (G3+)
ACID-BASE EXCESS: 9 mmol/L — AB (ref 0.0–2.0)
BICARBONATE: 36.3 mmol/L — AB (ref 20.0–28.0)
O2 SAT: 97 %
PO2 ART: 103 mmHg (ref 83.0–108.0)
TCO2: 38 mmol/L — ABNORMAL HIGH (ref 22–32)
pCO2 arterial: 64.3 mmHg — ABNORMAL HIGH (ref 32.0–48.0)
pH, Arterial: 7.363 (ref 7.350–7.450)

## 2018-04-25 LAB — BASIC METABOLIC PANEL
ANION GAP: 3 — AB (ref 5–15)
BUN: 23 mg/dL — AB (ref 6–20)
CO2: 37 mmol/L — ABNORMAL HIGH (ref 22–32)
Calcium: 7.7 mg/dL — ABNORMAL LOW (ref 8.9–10.3)
Chloride: 108 mmol/L (ref 101–111)
Creatinine, Ser: 1.48 mg/dL — ABNORMAL HIGH (ref 0.61–1.24)
GFR calc Af Amer: >60 mL/min (ref >60)
GFR calc non Af Amer: 58 mL/min — ABNORMAL LOW (ref >60)
Glucose, Bld: 127 mg/dL — ABNORMAL HIGH (ref 65–99)
POTASSIUM: 3.8 mmol/L (ref 3.5–5.1)
SODIUM: 148 mmol/L — AB (ref 135–145)

## 2018-04-25 LAB — MAGNESIUM: MAGNESIUM: 2.7 mg/dL — AB (ref 1.7–2.4)

## 2018-04-25 LAB — PHOSPHORUS: Phosphorus: 2 mg/dL — ABNORMAL LOW (ref 2.5–4.6)

## 2018-04-25 MED ORDER — DEXTROSE 5 % IV SOLN: INTRAVENOUS | Status: AC

## 2018-04-25 MED ORDER — PANTOPRAZOLE SODIUM 40 MG IV SOLR
40.0000 mg | INTRAVENOUS | Status: DC
  Administered 2018-04-25 – 2018-04-29 (×5): 40 mg via INTRAVENOUS
  Filled 2018-04-25 (×8): qty 40

## 2018-04-25 MED ORDER — POTASSIUM PHOSPHATES 15 MMOLE/5ML IV SOLN
25.0000 mmol | Freq: Once | INTRAVENOUS | Status: AC
  Administered 2018-04-25: 25 mmol via INTRAVENOUS
  Filled 2018-04-25: qty 8.33

## 2018-04-25 MED ORDER — FUROSEMIDE 10 MG/ML IJ SOLN
80.0000 mg | Freq: Once | INTRAMUSCULAR | Status: AC
  Administered 2018-04-25: 80 mg via INTRAVENOUS
  Filled 2018-04-25: qty 8

## 2018-04-25 MED ORDER — TRACE MINERALS CR-CU-MN-SE-ZN 10-1000-500-60 MCG/ML IV SOLN
INTRAVENOUS | Status: AC
  Administered 2018-04-25: 18:00:00 via INTRAVENOUS
  Filled 2018-04-25: qty 672

## 2018-04-25 NOTE — Progress Notes (Signed)
Pt remains sedated, paralyzed, and intermittently prone for severe ARDS. Will follow from a distance until ARDS improves and there is a meaningful neurologic exam to better assess neurologic prognosis.

## 2018-04-25 NOTE — Progress Notes (Signed)
Pt placed on prone position per Dr. Megan MansJ. Wyatt.  Pt tolerated well.

## 2018-04-25 NOTE — Progress Notes (Signed)
While changing the tube holder, skin tears were found on pts cheeks. The skin tears were covered with petroleum gauze and Tegaderm.
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
## 2018-04-25 NOTE — Progress Notes (Signed)
Pt turned to supine position.  Pt desated to low 80's but recovered to 92% with an FIO2 of 50% after 10 minutes.  RN and family at bedside. RT will continue to monitor.
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
## 2018-04-25 NOTE — Progress Notes (Signed)
This note also relates to the following rows which could not be included: SpO2 - Cannot attach notes to unvalidated device data  Head turned for proning at this time.

## 2018-04-25 NOTE — Progress Notes (Signed)
Cortrak Tube Team Note:  Consult received to place a Cortrak feeding tube.   A 10 F Cortrak tube was placed in the L nare and secured with a nasal bridle at 88 cm. Per the Cortrak monitor reading the tube tip is post-pyloric.   X-ray is required, abdominal x-ray has been ordered by the Cortrak team. Please confirm tube placement before using the Cortrak tube.   If the tube becomes dislodged please keep the tube and contact the Cortrak team at [email or web address] (password [email or web address]) for replacement.  If after hours and replacement cannot be delayed, place a NG tube and confirm placement with an abdominal x-ray.     Trenton GammonJessica Janete Quilling, MS, RD, LDN, [facility] Inpatient Clinical Dietitian Pager # [phone] After hours/weekend pager # [phone]

## 2018-04-25 NOTE — Progress Notes (Signed)
Trauma Service Note  Subjective: Patient is in the prone position, Only FIO2 40%.  CXR is pending.   ABG with pCO2 60's, paO2 > 100.  Objective: Vital signs in last 24 hours: Temp:  [99.9 F (37.7 C)-102.4 F (39.1 C)] 100 F (37.8 C) (06/10 0700) Pulse Rate:  [80-109] 92 (06/10 0734) Resp:  [29-32] 32 (06/10 0734) BP: (112-163)/(51-85) 119/56 (06/10 0734) SpO2:  [92 %-100 %] 100 % (06/10 0734) Arterial Line BP: (130-173)/(51-104) 150/58 (06/10 0700) FiO2 (%):  [40 %-60 %] 40 % (06/10 0734) Last BM Date: (PTA)  Intake/Output from previous day: 06/09 0701 - 06/10 0700 In: 4326.9 [I.V.:4126.9; IV Piggyback:200] Out: 6550 [Urine:6450; Emesis/NG output:100] Intake/Output this shift: No intake/output data recorded.  General: Paralyzed and sedated.  IVF D5W at 100 for hypernatremia, cut back to 80.    Lungs: Coarse breath sounds bilaterally.  No wheezes,  CXR shows bilateral basilar atelectasis, but mostly consolidation in the LLL.  Diffuse fluffy infiltrates  Abd: Soft, but difficult to assess in the prone position.  He is paralyzed and sedated and therefore his tube feedings have been stopped.  Extremities: No changes  Neuro: Paralyzed and sedated  Lab Results: CBC  Recent Labs    04/24/18 0449 04/25/18 0500  WBC 25.1* 21.3*  HGB 8.0* 7.6*  HCT 25.8* 24.3*  PLT 126* 169   BMET Recent Labs    04/24/18 0449 04/25/18 0500  NA 150* 148*  K 3.8 3.8  CL 106 108  CO2 36* 37*  GLUCOSE 134* 127*  BUN 31* 23*  CREATININE 1.69* 1.48*  CALCIUM 7.7* 7.7*   PT/INR Recent Labs    04/23/18 1641  LABPROT 16.2*  INR 1.31   ABG Recent Labs    04/24/18 2043 04/25/18 0451  PHART 7.380 7.363  HCO3 42.5* 36.3*    Studies/Results: Dg Chest Port 1 View  Result Date: 04/24/2018 CLINICAL DATA:  Acute respiratory failure EXAM: PORTABLE CHEST 1 VIEW COMPARISON:  Chest radiograph from one day prior. FINDINGS: Endotracheal tube tip is 5.6 cm above the carina. Enteric tube  terminates in the very proximal stomach. Left PICC terminates over the right atrium. Stable cardiomediastinal silhouette with normal heart size. No pneumothorax. No pleural effusion. Extensive consolidation in the parahilar and basilar left lung, worsened. Hazy opacity throughout the remaining lungs bilaterally, worsened. IMPRESSION: 1. Support structures as detailed. 2. Worsened extensive parahilar and basilar left lung consolidation with worsened hazy opacity throughout the remaining lungs, which could represent multilobar pneumonia, diffuse alveolar hemorrhage and/or ARDS. Electronically Signed   By: Delbert Phenix M.D.   On: 04/24/2018 08:16   Dg Chest Port 1 View  Result Date: 04/23/2018 CLINICAL DATA:  Oxygen desaturation EXAM: PORTABLE CHEST 1 VIEW COMPARISON:  Portable exam 1559 hours compared to 0530 hours FINDINGS: Rotated to the RIGHT. Tip of endotracheal tube projects 4.2 cm above carina. Nasogastric tube extends into abdomen. LEFT arm PICC line tip projects over SVC. Stable heart size. BILATERAL pulmonary infiltrates LEFT greater than RIGHT unchanged. Chronic elevation of RIGHT diaphragm. No gross pleural effusion or pneumothorax. IMPRESSION: Persistent BILATERAL pulmonary infiltrates LEFT greater than RIGHT Electronically Signed   By: Ulyses Southward M.D.   On: 04/23/2018 16:45    Anti-infectives: Anti-infectives (From admission, onward)   Start     Dose/Rate Route Frequency Ordered Stop   04/22/18 1330  vancomycin (VANCOCIN) 1,500 mg in sodium chloride 0.9 % 500 mL IVPB  Status:  Discontinued     1,500 mg 250 mL/hr over  120 Minutes Intravenous Every 24 hours 04/21/18 1301 04/24/18 0853   04/21/18 1300  vancomycin (VANCOCIN) 2,500 mg in sodium chloride 0.9 % 500 mL IVPB     2,500 mg 250 mL/hr over 120 Minutes Intravenous  Once 04/21/18 1257 04/21/18 1633   04/16/18 0730  piperacillin-tazobactam (ZOSYN) IVPB 3.375 g     3.375 g 12.5 mL/hr over 240 Minutes Intravenous Every 8 hours 03/26/2018  2304     04/07/2018 2315  piperacillin-tazobactam (ZOSYN) IVPB 3.375 g     3.375 g 100 mL/hr over 30 Minutes Intravenous  Once 03/30/2018 2303 03/23/2018 2349   04/09/2018 2130  ceFAZolin (ANCEF) IVPB 2g/100 mL premix     2 g 200 mL/hr over 30 Minutes Intravenous  Once 04/05/2018 2129 04/09/2018 2230      Assessment/Plan: s/p  Start TPN.  Diuresis Cut back a bit on the D5W to 80. PPI  LOS: 10 days   Marta LamasJames O. Gae BonWyatt, III, MD, FACS [phone] Trauma Surgeon 04/25/2018
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
## 2018-04-25 NOTE — Progress Notes (Signed)
Spoke with Dr. Lindie SpruceWyatt while he was on the unit. He stated to prone the pt again for the night.

## 2018-04-25 NOTE — Progress Notes (Signed)
Pt in prone position. Head turned from right to left. Arms changed positions. ETT remained stable at 26. Pt tolerated well.

## 2018-04-26 ENCOUNTER — Inpatient Hospital Stay (HOSPITAL_COMMUNITY): Payer: 59

## 2018-04-26 LAB — BLOOD GAS, ARTERIAL
Acid-Base Excess: 11.2 mmol/L — ABNORMAL HIGH (ref 0.0–2.0)
Bicarbonate: 36.3 mmol/L — ABNORMAL HIGH (ref 20.0–28.0)
DRAWN BY: 44135
FIO2: 50
MECHVT: 490 mL
O2 SAT: 96.8 %
PATIENT TEMPERATURE: 100.9
PCO2 ART: 62.1 mmHg — AB (ref 32.0–48.0)
PEEP: 8 cmH2O
PO2 ART: 100 mmHg (ref 83.0–108.0)
RATE: 32 resp/min
pH, Arterial: 7.391 (ref 7.350–7.450)

## 2018-04-26 LAB — CULTURE, BLOOD (ROUTINE X 2)
Culture: NO GROWTH
Culture: NO GROWTH
SPECIAL REQUESTS: ADEQUATE
SPECIAL REQUESTS: ADEQUATE

## 2018-04-26 LAB — COMPREHENSIVE METABOLIC PANEL
ALT: 56 U/L (ref 17–63)
AST: 135 U/L — AB (ref 15–41)
Albumin: 1.6 g/dL — ABNORMAL LOW (ref 3.5–5.0)
Alkaline Phosphatase: 76 U/L (ref 38–126)
Anion gap: 6 (ref 5–15)
BUN: 22 mg/dL — AB (ref 6–20)
CHLORIDE: 104 mmol/L (ref 101–111)
CO2: 35 mmol/L — ABNORMAL HIGH (ref 22–32)
Calcium: 7.7 mg/dL — ABNORMAL LOW (ref 8.9–10.3)
Creatinine, Ser: 1.44 mg/dL — ABNORMAL HIGH (ref 0.61–1.24)
GFR calc Af Amer: >60 mL/min (ref >60)
GFR, EST NON AFRICAN AMERICAN: 60 mL/min — AB (ref >60)
Glucose, Bld: 123 mg/dL — ABNORMAL HIGH (ref 65–99)
POTASSIUM: 3.9 mmol/L (ref 3.5–5.1)
Sodium: 145 mmol/L (ref 135–145)
Total Bilirubin: 1.8 mg/dL — ABNORMAL HIGH (ref 0.3–1.2)
Total Protein: 5.8 g/dL — ABNORMAL LOW (ref 6.5–8.1)

## 2018-04-26 LAB — CBC WITH DIFFERENTIAL/PLATELET
BASOS ABS: 0.2 10*3/uL — AB (ref 0.0–0.1)
Basophils Relative: 1 %
EOS ABS: 0.2 10*3/uL (ref 0.0–0.7)
Eosinophils Relative: 1 %
HCT: 24.7 % — ABNORMAL LOW (ref 39.0–52.0)
Hemoglobin: 7.7 g/dL — ABNORMAL LOW (ref 13.0–17.0)
LYMPHS ABS: 1.6 10*3/uL (ref 0.7–4.0)
Lymphocytes Relative: 8 %
MCH: 29.5 pg (ref 26.0–34.0)
MCHC: 31.2 g/dL (ref 30.0–36.0)
MCV: 94.6 fL (ref 78.0–100.0)
MONO ABS: 1 10*3/uL (ref 0.1–1.0)
Monocytes Relative: 5 %
Neutro Abs: 17.5 10*3/uL — ABNORMAL HIGH (ref 1.7–7.7)
Neutrophils Relative %: 85 %
Platelets: 233 10*3/uL (ref 150–400)
RBC: 2.61 MIL/uL — AB (ref 4.22–5.81)
RDW: 13.6 % (ref 11.5–15.5)
WBC: 20.5 10*3/uL — AB (ref 4.0–10.5)

## 2018-04-26 LAB — GLUCOSE, CAPILLARY
GLUCOSE-CAPILLARY: 124 mg/dL — AB (ref 65–99)
GLUCOSE-CAPILLARY: 128 mg/dL — AB (ref 65–99)
GLUCOSE-CAPILLARY: 148 mg/dL — AB (ref 65–99)
Glucose-Capillary: 120 mg/dL — ABNORMAL HIGH (ref 65–99)
Glucose-Capillary: 124 mg/dL — ABNORMAL HIGH (ref 65–99)
Glucose-Capillary: 130 mg/dL — ABNORMAL HIGH (ref 65–99)
Glucose-Capillary: 144 mg/dL — ABNORMAL HIGH (ref 65–99)

## 2018-04-26 LAB — TRIGLYCERIDES: TRIGLYCERIDES: 227 mg/dL — AB (ref <150)

## 2018-04-26 LAB — MAGNESIUM: MAGNESIUM: 2.6 mg/dL — AB (ref 1.7–2.4)

## 2018-04-26 LAB — PREALBUMIN: PREALBUMIN: 6.6 mg/dL — AB (ref 18–38)

## 2018-04-26 LAB — PHOSPHORUS: PHOSPHORUS: 3.3 mg/dL (ref 2.5–4.6)

## 2018-04-26 MED ORDER — TRAVASOL 10 % IV SOLN
INTRAVENOUS | Status: AC
  Administered 2018-04-26: 18:00:00 via INTRAVENOUS
  Filled 2018-04-26: qty 1260

## 2018-04-26 MED ORDER — DEXTROSE 5 % IV SOLN
INTRAVENOUS | Status: DC
  Administered 2018-04-26 – 2018-04-27 (×2): via INTRAVENOUS

## 2018-04-26 NOTE — Progress Notes (Signed)
Pt placed in supine position per MD order.  Pt desated, FIO2 increased to 60%, sat recovered to 92%. RN notified. Pt tolerated position well.  Family at bedside.
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
## 2018-04-26 NOTE — Progress Notes (Signed)
Patient is on 60% FIO2 and PEEP is down to 8.  Patient can be kept ina supine position since his hypoxia has overall improved.

## 2018-04-26 NOTE — Progress Notes (Signed)
   04/26/18 1000  Clinical Encounter Type  Visited With Family  Visit Type Follow-up  Referral From Chaplain  Consult/Referral To Chaplain  Spiritual Encounters  Spiritual Needs Emotional  Stress Factors  Family Stress Factors Major life changes  Chaplain visited with PT brother who was in the room alone.  The brother of the PT came in from [place] to provide support.  PT brother is an [employer] minister and prayed and anointed PT.  The family seems to be in high spirits and hopeful.
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
## 2018-04-27 ENCOUNTER — Inpatient Hospital Stay (HOSPITAL_COMMUNITY): Payer: 59

## 2018-04-27 LAB — CBC WITH DIFFERENTIAL/PLATELET
BASOS PCT: 0 %
BLASTS: 0 %
Band Neutrophils: 20 %
Basophils Absolute: 0 10*3/uL (ref 0.0–0.1)
Eosinophils Absolute: 0 10*3/uL (ref 0.0–0.7)
Eosinophils Relative: 0 %
HCT: 24.2 % — ABNORMAL LOW (ref 39.0–52.0)
HEMOGLOBIN: 7.5 g/dL — AB (ref 13.0–17.0)
Lymphocytes Relative: 10 %
Lymphs Abs: 2.2 10*3/uL (ref 0.7–4.0)
MCH: 29.3 pg (ref 26.0–34.0)
MCHC: 31 g/dL (ref 30.0–36.0)
MCV: 94.5 fL (ref 78.0–100.0)
MONOS PCT: 3 %
Metamyelocytes Relative: 2 %
Monocytes Absolute: 0.7 10*3/uL (ref 0.1–1.0)
Myelocytes: 2 %
NEUTROS ABS: 18.8 10*3/uL — AB (ref 1.7–7.7)
NEUTROS PCT: 63 %
NRBC: 0 /100{WBCs}
OTHER: 0 %
PROMYELOCYTES RELATIVE: 0 %
Platelets: 354 10*3/uL (ref 150–400)
RBC: 2.56 MIL/uL — ABNORMAL LOW (ref 4.22–5.81)
RDW: 13.8 % (ref 11.5–15.5)
SMEAR REVIEW: ADEQUATE
WBC: 21.7 10*3/uL — ABNORMAL HIGH (ref 4.0–10.5)

## 2018-04-27 LAB — GLUCOSE, CAPILLARY
GLUCOSE-CAPILLARY: 131 mg/dL — AB (ref 65–99)
Glucose-Capillary: 142 mg/dL — ABNORMAL HIGH (ref 65–99)
Glucose-Capillary: 141 mg/dL — ABNORMAL HIGH (ref 65–99)
Glucose-Capillary: 153 mg/dL — ABNORMAL HIGH (ref 65–99)
Glucose-Capillary: 204 mg/dL — ABNORMAL HIGH (ref 65–99)
Glucose-Capillary: 192 mg/dL — ABNORMAL HIGH (ref 65–99)

## 2018-04-27 LAB — POCT I-STAT 3, ART BLOOD GAS (G3+)
Acid-Base Excess: 5 mmol/L — ABNORMAL HIGH (ref 0.0–2.0)
Bicarbonate: 30.4 mmol/L — ABNORMAL HIGH (ref 20.0–28.0)
O2 Saturation: 98 %
PH ART: 7.373 (ref 7.350–7.450)
TCO2: 32 mmol/L (ref 22–32)
pCO2 arterial: 52.6 mmHg — ABNORMAL HIGH (ref 32.0–48.0)
pO2, Arterial: 113 mmHg — ABNORMAL HIGH (ref 83.0–108.0)

## 2018-04-27 LAB — BASIC METABOLIC PANEL
Anion gap: 5 (ref 5–15)
BUN: 26 mg/dL — AB (ref 6–20)
CHLORIDE: 108 mmol/L (ref 101–111)
CO2: 31 mmol/L (ref 22–32)
Calcium: 7.8 mg/dL — ABNORMAL LOW (ref 8.9–10.3)
Creatinine, Ser: 1.24 mg/dL (ref 0.61–1.24)
GFR calc Af Amer: >60 mL/min (ref >60)
GFR calc non Af Amer: >60 mL/min (ref >60)
GLUCOSE: 153 mg/dL — AB (ref 65–99)
POTASSIUM: 4.3 mmol/L (ref 3.5–5.1)
Sodium: 144 mmol/L (ref 135–145)

## 2018-04-27 LAB — MAGNESIUM: Magnesium: 2.5 mg/dL — ABNORMAL HIGH (ref 1.7–2.4)

## 2018-04-27 MED ORDER — PIVOT 1.5 CAL PO LIQD
1000.0000 mL | ORAL | Status: DC
  Administered 2018-04-27: 1000 mL
  Filled 2018-04-27: qty 1000

## 2018-04-27 MED ORDER — TRAVASOL 10 % IV SOLN
INTRAVENOUS | Status: DC
  Administered 2018-04-27: 17:00:00 via INTRAVENOUS
  Filled 2018-04-27: qty 1764
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
## 2018-04-27 NOTE — Progress Notes (Signed)
Trauma Service Note  Subjective: Patient is still paralyzed chemically and heavily sedated.  Peak airway pressures are in the mid 80's, oxygen saturations 99-100%.  ABG pending.  Objective: Vital signs in last 24 hours: Temp:  [100 F (37.8 C)-101.8 F (38.8 C)] 100 F (37.8 C) (06/12 0700) Pulse Rate:  [68-109] 68 (06/12 0700) Resp:  [26-32] 32 (06/12 0700) BP: (121-170)/(59-81) 130/72 (06/12 0700) SpO2:  [92 %-99 %] 98 % (06/12 0700) Arterial Line BP: (117-188)/(55-124) 135/68 (06/12 0600) FiO2 (%):  [50 %-70 %] 60 % (06/12 0400) Last BM Date: (PTA)  Intake/Output from previous day: 06/11 0701 - 06/12 0700 In: 4007.4 [I.V.:4007.4] Out: 2375 [Urine:2375] Intake/Output this shift: No intake/output data recorded.  General: Paralyzed ans sedated.  Lungs: Clear to auscultation.  CXR much improved in terms of aeration and improvement of consolidation.  Abd: Soft, some bowel sounds.  Has not been getting tube feedings and will not until Cortrak can get into the post-pyloric position  Extremities: No changes.  Neuro: Sedated and paralyzed  Lab Results: CBC  Recent Labs    04/26/18 0500 04/27/18 0457  WBC 20.5* 21.7*  HGB 7.7* 7.5*  HCT 24.7* 24.2*  PLT 233 354   BMET Recent Labs    04/26/18 0500 04/27/18 0457  NA 145 144  K 3.9 4.3  CL 104 108  CO2 35* 31  GLUCOSE 123* 153*  BUN 22* 26*  CREATININE 1.44* 1.24  CALCIUM 7.7* 7.8*   PT/INR No results for input(s): LABPROT, INR in the last 72 hours. ABG Recent Labs    04/25/18 0451 04/26/18 0500  PHART 7.363 7.391  HCO3 36.3* 36.3*    Studies/Results: Dg Chest Port 1 View  Result Date: 04/26/2018 CLINICAL DATA:  Intubation EXAM: PORTABLE CHEST 1 VIEW COMPARISON:  04/26/2018 FINDINGS: Endotracheal tube and feeding tube unchanged. Bibasilar airspace disease similar prior. Small LEFT effusion unchanged. No pneumothorax. PICC line with tip in the RIGHT atrium IMPRESSION: 1. Stable bibasilar airspace  disease suggesting pneumonia or edema. 2. Stable LEFT effusion. 3. Stable support apparatus. 4. PICC line with tip in the distal SVC/RIGHT atrium. Electronically Signed   By: Genevive Bi M.D.   On: 04/26/2018 11:51   Dg Chest Port 1 View  Result Date: 04/26/2018 CLINICAL DATA:  Respiratory failure EXAM: PORTABLE CHEST 1 VIEW COMPARISON:  04/25/2018 FINDINGS: Cardiac shadow is stable. Diffuse bilateral infiltrates are again identified and stable. The endotracheal tube, left-sided PICC line and feeding catheter are noted in satisfactory position. No bony abnormality is seen. IMPRESSION: Feeding catheter now seen within the stomach. The remainder of the exam is stable with diffuse bilateral infiltrates noted. Electronically Signed   By: Alcide Clever M.D.   On: 04/26/2018 08:55   Dg Abd Portable 1v  Result Date: 04/25/2018 CLINICAL DATA:  Check feeding catheter placement EXAM: PORTABLE ABDOMEN - 1 VIEW COMPARISON:  04/22/2018 FINDINGS: Feeding catheter is noted within the distal aspect of the stomach directed towards the pylorus. Scattered large and small bowel gas is noted. No obstructive changes are seen. No bony abnormality is noted. IMPRESSION: Feeding catheter in the distal stomach. Electronically Signed   By: Alcide Clever M.D.   On: 04/25/2018 15:22    Anti-infectives: Anti-infectives (From admission, onward)   Start     Dose/Rate Route Frequency Ordered Stop   04/22/18 1330  vancomycin (VANCOCIN) 1,500 mg in sodium chloride 0.9 % 500 mL IVPB  Status:  Discontinued     1,500 mg 250 mL/hr over 120 Minutes  Intravenous Every 24 hours 04/21/18 1301 04/24/18 0853   04/21/18 1300  vancomycin (VANCOCIN) 2,500 mg in sodium chloride 0.9 % 500 mL IVPB     2,500 mg 250 mL/hr over 120 Minutes Intravenous  Once 04/21/18 1257 04/21/18 1633   04/16/18 0730  piperacillin-tazobactam (ZOSYN) IVPB 3.375 g  Status:  Discontinued     3.375 g 12.5 mL/hr over 240 Minutes Intravenous Every 8 hours 2018-07-05 2304  04/26/18 0942   2018-07-05 2315  piperacillin-tazobactam (ZOSYN) IVPB 3.375 g     3.375 g 100 mL/hr over 30 Minutes Intravenous  Once 2018-07-05 2303 2018-07-05 2349   2018-07-05 2130  ceFAZolin (ANCEF) IVPB 2g/100 mL premix     2 g 200 mL/hr over 30 Minutes Intravenous  Once 2018-07-05 2129 2018-07-05 2230      Assessment/Plan: s/p  Check ABG.  Seeif Cortrak can be placed postpyloric.  Continue TPN.  LOS: 12 days   Marta LamasJames O. Gae BonWyatt, III, MD, FACS [phone] Trauma Surgeon 04/27/2018
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
## 2018-04-27 NOTE — Progress Notes (Addendum)
Cortrak Tube Team Note:   RD retraced Cortrak tube today. Tube looks to have migrated post pyloric per cortrak monitor. An xray was obtained today at 1317 and read as  "Weighted tip feeding tube in place with tip likely at the junction of the third and fourth portions of the duodenum."   A 10 F Cortrak tube remains placed in the right nare and secured with a nasal bridle at 88 cm.    Vanessa Kickarly Trinnity Breunig RD, LDN Clinical Nutrition Pager # [phone]- [phone]
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
## 2018-04-28 ENCOUNTER — Inpatient Hospital Stay (HOSPITAL_COMMUNITY): Payer: 59

## 2018-04-28 LAB — GLUCOSE, CAPILLARY
GLUCOSE-CAPILLARY: 217 mg/dL — AB (ref 65–99)
GLUCOSE-CAPILLARY: 260 mg/dL — AB (ref 65–99)
GLUCOSE-CAPILLARY: 341 mg/dL — AB (ref 65–99)
GLUCOSE-CAPILLARY: 244 mg/dL — AB (ref 65–99)
GLUCOSE-CAPILLARY: 238 mg/dL — AB (ref 65–99)
Glucose-Capillary: 300 mg/dL — ABNORMAL HIGH (ref 65–99)
Glucose-Capillary: 244 mg/dL — ABNORMAL HIGH (ref 65–99)
Glucose-Capillary: 212 mg/dL — ABNORMAL HIGH (ref 65–99)

## 2018-04-28 LAB — CBC WITH DIFFERENTIAL/PLATELET
BASOS ABS: 0 10*3/uL (ref 0.0–0.1)
Basophils Relative: 0 %
EOS ABS: 0.2 10*3/uL (ref 0.0–0.7)
Eosinophils Relative: 1 %
HCT: 24.9 % — ABNORMAL LOW (ref 39.0–52.0)
Hemoglobin: 7.7 g/dL — ABNORMAL LOW (ref 13.0–17.0)
Lymphocytes Relative: 9 %
Lymphs Abs: 2.2 10*3/uL (ref 0.7–4.0)
MCH: 29.6 pg (ref 26.0–34.0)
MCHC: 30.9 g/dL (ref 30.0–36.0)
MCV: 95.8 fL (ref 78.0–100.0)
MONO ABS: 1.9 10*3/uL — AB (ref 0.1–1.0)
MONOS PCT: 8 %
Neutro Abs: 19.6 10*3/uL — ABNORMAL HIGH (ref 1.7–7.7)
Neutrophils Relative %: 82 %
PLATELETS: 462 10*3/uL — AB (ref 150–400)
RBC: 2.6 MIL/uL — AB (ref 4.22–5.81)
RDW: 13.9 % (ref 11.5–15.5)
WBC: 23.9 10*3/uL — AB (ref 4.0–10.5)

## 2018-04-28 LAB — COMPREHENSIVE METABOLIC PANEL
ALK PHOS: 92 U/L (ref 38–126)
ALT: 86 U/L — ABNORMAL HIGH (ref 17–63)
ANION GAP: 7 (ref 5–15)
AST: 174 U/L — ABNORMAL HIGH (ref 15–41)
Albumin: 1.7 g/dL — ABNORMAL LOW (ref 3.5–5.0)
BUN: 27 mg/dL — ABNORMAL HIGH (ref 6–20)
CO2: 29 mmol/L (ref 22–32)
Calcium: 7.9 mg/dL — ABNORMAL LOW (ref 8.9–10.3)
Chloride: 107 mmol/L (ref 101–111)
Creatinine, Ser: 1.05 mg/dL (ref 0.61–1.24)
GFR calc non Af Amer: >60 mL/min (ref >60)
Glucose, Bld: 249 mg/dL — ABNORMAL HIGH (ref 65–99)
Potassium: 4.7 mmol/L (ref 3.5–5.1)
SODIUM: 143 mmol/L (ref 135–145)
TOTAL PROTEIN: 6.4 g/dL — AB (ref 6.5–8.1)
Total Bilirubin: 1.6 mg/dL — ABNORMAL HIGH (ref 0.3–1.2)

## 2018-04-28 LAB — CBC
HCT: 28.8 % — ABNORMAL LOW (ref 39.0–52.0)
Hemoglobin: 8.5 g/dL — ABNORMAL LOW (ref 13.0–17.0)
MCH: 29.1 pg (ref 26.0–34.0)
MCHC: 29.5 g/dL — ABNORMAL LOW (ref 30.0–36.0)
MCV: 98.6 fL (ref 78.0–100.0)
PLATELETS: 520 10*3/uL — AB (ref 150–400)
RBC: 2.92 MIL/uL — AB (ref 4.22–5.81)
RDW: 14.1 % (ref 11.5–15.5)
WBC: 19.9 10*3/uL — AB (ref 4.0–10.5)

## 2018-04-28 LAB — URINALYSIS, ROUTINE W REFLEX MICROSCOPIC
BILIRUBIN URINE: NEGATIVE
Bacteria, UA: NONE SEEN
GLUCOSE, UA: NEGATIVE mg/dL
KETONES UR: NEGATIVE mg/dL
LEUKOCYTES UA: NEGATIVE
NITRITE: NEGATIVE
PH: 5 (ref 5.0–8.0)
Protein, ur: NEGATIVE mg/dL
SPECIFIC GRAVITY, URINE: 1.008 (ref 1.005–1.030)

## 2018-04-28 LAB — POCT I-STAT 3, ART BLOOD GAS (G3+)
ACID-BASE EXCESS: 1 mmol/L (ref 0.0–2.0)
Bicarbonate: 27.5 mmol/L (ref 20.0–28.0)
O2 SAT: 92 %
PCO2 ART: 53.6 mmHg — AB (ref 32.0–48.0)
PH ART: 7.324 — AB (ref 7.350–7.450)
PO2 ART: 73 mmHg — AB (ref 83.0–108.0)
Patient temperature: 100.6
TCO2: 29 mmol/L (ref 22–32)

## 2018-04-28 LAB — SODIUM, URINE, RANDOM: SODIUM UR: 13 mmol/L

## 2018-04-28 LAB — MAGNESIUM: Magnesium: 2.6 mg/dL — ABNORMAL HIGH (ref 1.7–2.4)

## 2018-04-28 LAB — OSMOLALITY, URINE: OSMOLALITY UR: 178 mosm/kg — AB (ref 300–900)

## 2018-04-28 LAB — PHOSPHORUS: PHOSPHORUS: 3.4 mg/dL (ref 2.5–4.6)

## 2018-04-28 MED ORDER — IOPAMIDOL (ISOVUE-370) INJECTION 76%
INTRAVENOUS | Status: AC
  Administered 2018-04-28: 100 mL
  Filled 2018-04-28: qty 100

## 2018-04-28 MED ORDER — HYDRALAZINE HCL 20 MG/ML IJ SOLN: 20.0000 mg | INTRAMUSCULAR | Status: DC | PRN

## 2018-04-28 MED ORDER — ALBUMIN HUMAN 5 % IV SOLN
12.5000 g | Freq: Once | INTRAVENOUS | Status: AC
  Administered 2018-04-28: 12.5 g via INTRAVENOUS
  Filled 2018-04-28: qty 250

## 2018-04-28 MED ORDER — SODIUM CHLORIDE 0.9 % IV BOLUS
1000.0000 mL | Freq: Once | INTRAVENOUS | Status: AC
  Administered 2018-04-28: 1000 mL via INTRAVENOUS

## 2018-04-28 MED ORDER — SODIUM CHLORIDE 0.9 % IV SOLN
INTRAVENOUS | Status: DC
  Administered 2018-04-28: 21:00:00 via INTRAVENOUS

## 2018-04-28 MED ORDER — SODIUM CHLORIDE 0.9 % IV SOLN
INTRAVENOUS | Status: DC
  Administered 2018-04-28: 1.8 [IU]/h via INTRAVENOUS
  Filled 2018-04-28: qty 1

## 2018-04-28 MED ORDER — VANCOMYCIN HCL 10 G IV SOLR
1250.0000 mg | Freq: Three times a day (TID) | INTRAVENOUS | Status: DC
  Administered 2018-04-29: 1250 mg via INTRAVENOUS
  Filled 2018-04-28 (×2): qty 1250

## 2018-04-28 MED ORDER — FREE WATER
200.0000 mL | Freq: Three times a day (TID) | Status: DC
  Administered 2018-04-28 – 2018-04-29 (×5): 200 mL

## 2018-04-28 MED ORDER — VANCOMYCIN HCL IN DEXTROSE 1-5 GM/200ML-% IV SOLN: 1000.0000 mg | Freq: Three times a day (TID) | INTRAVENOUS | Status: DC

## 2018-04-28 MED ORDER — SODIUM CHLORIDE 0.9 % IV SOLN
INTRAVENOUS | Status: DC | PRN
Start: 2018-04-28 — End: 2018-04-30

## 2018-04-28 MED ORDER — SODIUM CHLORIDE 0.9 % IV SOLN
2.0000 g | Freq: Three times a day (TID) | INTRAVENOUS | Status: DC
  Administered 2018-04-28 – 2018-04-29 (×2): 2 g via INTRAVENOUS
  Filled 2018-04-28 (×4): qty 2

## 2018-04-28 MED ORDER — TRAVASOL 10 % IV SOLN
INTRAVENOUS | Status: DC
  Filled 2018-04-28: qty 672

## 2018-04-28 MED ORDER — NOREPINEPHRINE 4 MG/250ML-% IV SOLN
0.0000 ug/min | INTRAVENOUS | Status: DC
Start: 2018-04-28 — End: 2018-04-28
  Administered 2018-04-28: 2 ug/min via INTRAVENOUS
  Filled 2018-04-28 (×2): qty 250

## 2018-04-28 MED ORDER — VANCOMYCIN HCL 10 G IV SOLR
2500.0000 mg | Freq: Once | INTRAVENOUS | Status: AC
  Administered 2018-04-28: 2500 mg via INTRAVENOUS
  Filled 2018-04-28 (×2): qty 2500

## 2018-04-28 MED ORDER — PIVOT 1.5 CAL PO LIQD
1000.0000 mL | ORAL | Status: DC
  Administered 2018-04-28: 1000 mL
  Filled 2018-04-28 (×2): qty 1000

## 2018-04-28 MED ORDER — NOREPINEPHRINE 16 MG/250ML-% IV SOLN
0.0000 ug/min | INTRAVENOUS | Status: DC
  Administered 2018-04-29: 40 ug/min via INTRAVENOUS
  Filled 2018-04-28 (×3): qty 250

## 2018-04-28 MED ORDER — PRO-STAT SUGAR FREE PO LIQD
60.0000 mL | Freq: Four times a day (QID) | ORAL | Status: DC
  Administered 2018-04-28 – 2018-04-29 (×5): 60 mL
  Filled 2018-04-28 (×5): qty 60

## 2018-04-28 MED ORDER — FUROSEMIDE 10 MG/ML IJ SOLN
40.0000 mg | Freq: Once | INTRAMUSCULAR | Status: AC
  Administered 2018-04-28: 40 mg via INTRAVENOUS
  Filled 2018-04-28: qty 4

## 2018-04-28 NOTE — Progress Notes (Addendum)
RN noticed patient's BIS in the 70s. Patient is maxed out on fentanyl and versed gtts at this time. Vital signs remain stable and patient is synchronous with the vent. E-Link made aware. No new orders at this time. Will continue to monitor and address any further issues.
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
## 2018-04-28 NOTE — Progress Notes (Signed)
Bite block in place.  

## 2018-04-28 NOTE — Progress Notes (Addendum)
Patient's BP increased to 190s systolic on A-line with appropriate wave form. E-Link MD made aware and orders to go ahead and turn off the Nimbex gtt now. Patient is still synchronous with the vent. Fentanyl and Versed gtts still infusing. Will continue to monitor.
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
## 2018-04-28 NOTE — Progress Notes (Signed)
Trauma Service Note  Subjective: Nimbex is off and the patient is not worsening neurologically.  Somnolent.  Oxygen saturation 93%.  PaO2 73.  Objective: Vital signs in last 24 hours: Temp:  [100 F (37.8 C)-101.5 F (38.6 C)] 101.3 F (38.5 C) (06/13 0700) Pulse Rate:  [66-101] 66 (06/13 0700) Resp:  [25-32] 27 (06/13 0700) BP: (123-179)/(69-86) 179/81 (06/13 0700) SpO2:  [91 %-99 %] 93 % (06/13 0700) Arterial Line BP: (87-193)/(60-83) 191/72 (06/13 0700) FiO2 (%):  [40 %-50 %] 40 % (06/13 0400) Weight:  [126.1 kg (278 lb)-126.2 kg (278 lb 3.5 oz)] 126.2 kg (278 lb 3.5 oz) (06/13 0405) Last BM Date: (PTA)  Intake/Output from previous day: 06/12 0701 - 06/13 0700 In: 4921.1 [I.V.:4568.8; NG/GT:352.3] Out: 2660 [Urine:2660] Intake/Output this shift: No intake/output data recorded.  General: Not awakening.  Having fevers.  Lungs: Clear to auscultation.  CXR shows increased pulmonary edema, more "fluffy" than yesterday.  Abd: Mildly distended, active bowel sounds.  Tolerated  Tube feedings at 20.  Will increase to goal  Extremities: No changes  Neuro: Somnolent.  Still heavily sedated.  Lab Results: CBC  Recent Labs    04/27/18 0457 04/28/18 0402  WBC 21.7* 23.9*  HGB 7.5* 7.7*  HCT 24.2* 24.9*  PLT 354 462*   BMET Recent Labs    04/27/18 0457 04/28/18 0402  NA 144 143  K 4.3 4.7  CL 108 107  CO2 31 29  GLUCOSE 153* 249*  BUN 26* 27*  CREATININE 1.24 1.05  CALCIUM 7.8* 7.9*   PT/INR No results for input(s): LABPROT, INR in the last 72 hours. ABG Recent Labs    04/27/18 0809 04/28/18 0423  PHART 7.373 7.324*  HCO3 30.4* 27.5    Studies/Results: Dg Chest Port 1 View  Result Date: 04/28/2018 CLINICAL DATA:  Respiratory failure EXAM: PORTABLE CHEST 1 VIEW COMPARISON:  04/27/2018 FINDINGS: Endotracheal tube in good position. Central venous catheter tip in the right atrium unchanged. Feeding tube enters the stomach. Progression of bibasilar airspace  disease.  No pneumothorax. IMPRESSION: Endotracheal tube in good position. Significant progression and bibasilar airspace disease. Electronically Signed   By: Marlan Palau M.D.   On: 04/28/2018 07:48   Dg Chest Port 1 View  Result Date: 04/27/2018 CLINICAL DATA:  Respiratory failure. EXAM: PORTABLE CHEST 1 VIEW COMPARISON:  04/26/2018 FINDINGS: Endotracheal tube terminates at the clavicles, 5.3 cm above the carina. Left PICC terminates over the high right atrium. A feeding tube courses into the left upper abdomen with tip not imaged. The cardiomediastinal silhouette is unchanged. There is persistent retrocardiac opacity in the left lower lobe with air bronchograms. Patchy left midlung airspace opacity is unchanged. Patchy right basilar opacity is stable to slightly improved. No large pleural effusion or pneumothorax is identified. IMPRESSION: Left greater than right lung airspace disease with possible slight improvement on the right. Electronically Signed   By: Sebastian Ache M.D.   On: 04/27/2018 08:59   Dg Chest Port 1 View  Result Date: 04/26/2018 CLINICAL DATA:  Intubation EXAM: PORTABLE CHEST 1 VIEW COMPARISON:  04/26/2018 FINDINGS: Endotracheal tube and feeding tube unchanged. Bibasilar airspace disease similar prior. Small LEFT effusion unchanged. No pneumothorax. PICC line with tip in the RIGHT atrium IMPRESSION: 1. Stable bibasilar airspace disease suggesting pneumonia or edema. 2. Stable LEFT effusion. 3. Stable support apparatus. 4. PICC line with tip in the distal SVC/RIGHT atrium. Electronically Signed   By: Genevive Bi M.D.   On: 04/26/2018 11:51   Dg Abd  Portable 1v  Result Date: 04/27/2018 CLINICAL DATA:  Feeding tube placement, post pyloric. EXAM: PORTABLE ABDOMEN - 1 VIEW COMPARISON:  None. FINDINGS: Weighted tip feeding tube tip is within the proximal small bowel, likely at the junction of the third and fourth portions of the duodenum. IMPRESSION: Weighted tip feeding tube in  place with tip likely at the junction of the third and fourth portions of the duodenum. Electronically Signed   By: Bary RichardStan  Maynard M.D.   On: 04/27/2018 13:22    Anti-infectives: Anti-infectives (From admission, onward)   Start     Dose/Rate Route Frequency Ordered Stop   04/22/18 1330  vancomycin (VANCOCIN) 1,500 mg in sodium chloride 0.9 % 500 mL IVPB  Status:  Discontinued     1,500 mg 250 mL/hr over 120 Minutes Intravenous Every 24 hours 04/21/18 1301 04/24/18 0853   04/21/18 1300  vancomycin (VANCOCIN) 2,500 mg in sodium chloride 0.9 % 500 mL IVPB     2,500 mg 250 mL/hr over 120 Minutes Intravenous  Once 04/21/18 1257 04/21/18 1633   04/16/18 0730  piperacillin-tazobactam (ZOSYN) IVPB 3.375 g  Status:  Discontinued     3.375 g 12.5 mL/hr over 240 Minutes Intravenous Every 8 hours 03/23/2018 2304 04/26/18 0942   03/17/2018 2315  piperacillin-tazobactam (ZOSYN) IVPB 3.375 g     3.375 g 100 mL/hr over 30 Minutes Intravenous  Once 04/04/2018 2303 03/29/2018 2349   03/21/2018 2130  ceFAZolin (ANCEF) IVPB 2g/100 mL premix     2 g 200 mL/hr over 30 Minutes Intravenous  Once 04/06/2018 2129 03/19/2018 2230      Assessment/Plan: s/p  Increase tube feedings.  WBC up without known source of infection. Will pan-culture but not start empiric antibiotics. Will stop TPN today and increase tube feedings to goal. Add free water. Will probably need trach and PEG soon.  LOS: 13 days   Marta LamasJames O. Gae BonWyatt, III, MD, FACS [phone] Trauma Surgeon 04/28/2018

## 2018-04-28 NOTE — Progress Notes (Signed)
Vent check completed. Neb tx done. Moved ETT to pts left. ETT secure - no breakdown noted on lips. Sputum obtained and sent to lab per MD order. RT will cont to follow
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
## 2018-04-28 NOTE — Progress Notes (Signed)
RT changed ETT holder with no complications

## 2018-04-28 NOTE — Plan of Care (Signed)
Pt currently has Cortrak with tube feeds being administered.  Will continue to monitor when patient's diet can be advanced.  Cameron PurpuraSusan Amayrany Cafaro RN
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
## 2018-04-28 NOTE — Procedures (Signed)
Arterial Catheter Insertion Procedure Note Cameron Krause [id number] Mar 14, 1976  Procedure: Insertion of Arterial Catheter  Indications: Blood pressure monitoring  Procedure Details Consent: Risks of procedure as well as the alternatives and risks of each were explained to the (patient/caregiver).  Consent for procedure obtained. Time Out: Verified patient identification, verified procedure, site/side was marked, verified correct patient position, special equipment/implants available, medications/allergies/relevent history reviewed, required imaging and test results available.  Performed  Maximum sterile technique was used including antiseptics, cap, gloves, gown, hand hygiene, mask and sheet. Skin prep: Chlorhexidine; local anesthetic administered 20 gauge catheter was inserted into right radial artery using the Seldinger technique. ULTRASOUND GUIDANCE USED: NO Evaluation Blood flow good; BP tracing good. Complications: No apparent complications.   Cameron Krause, Aloha GellJohn P 04/28/2018

## 2018-04-29 ENCOUNTER — Inpatient Hospital Stay (HOSPITAL_COMMUNITY): Payer: 59

## 2018-04-29 DIAGNOSIS — E232 Diabetes insipidus: Secondary | ICD-10-CM

## 2018-04-29 DIAGNOSIS — E870 Hyperosmolality and hypernatremia: Secondary | ICD-10-CM

## 2018-04-29 DIAGNOSIS — J9601 Acute respiratory failure with hypoxia: Secondary | ICD-10-CM

## 2018-04-29 LAB — ECHOCARDIOGRAM COMPLETE
Height: 74 in
WEIGHTICAEL: 4183.45 [oz_av]

## 2018-04-29 LAB — BASIC METABOLIC PANEL
ANION GAP: 7 (ref 5–15)
Anion gap: 7 (ref 5–15)
BUN: 51 mg/dL — ABNORMAL HIGH (ref 6–20)
BUN: 55 mg/dL — ABNORMAL HIGH (ref 6–20)
BUN: 46 mg/dL — AB (ref 6–20)
BUN: 53 mg/dL — AB (ref 6–20)
CALCIUM: 8.4 mg/dL — AB (ref 8.9–10.3)
CHLORIDE: 128 mmol/L — AB (ref 101–111)
CHLORIDE: 130 mmol/L — AB (ref 101–111)
CO2: 32 mmol/L (ref 22–32)
CO2: 30 mmol/L (ref 22–32)
CO2: 29 mmol/L (ref 22–32)
CO2: 27 mmol/L (ref 22–32)
CREATININE: 1.97 mg/dL — AB (ref 0.61–1.24)
CREATININE: 1.74 mg/dL — AB (ref 0.61–1.24)
Calcium: 8.8 mg/dL — ABNORMAL LOW (ref 8.9–10.3)
Calcium: 8.2 mg/dL — ABNORMAL LOW (ref 8.9–10.3)
Calcium: 8.4 mg/dL — ABNORMAL LOW (ref 8.9–10.3)
Chloride: >130 mmol/L (ref 101–111)
Creatinine, Ser: 2.26 mg/dL — ABNORMAL HIGH (ref 0.61–1.24)
Creatinine, Ser: 1.95 mg/dL — ABNORMAL HIGH (ref 0.61–1.24)
GFR calc non Af Amer: 35 mL/min — ABNORMAL LOW (ref >60)
GFR, EST AFRICAN AMERICAN: 47 mL/min — AB (ref >60)
GFR, EST AFRICAN AMERICAN: 55 mL/min — AB (ref >60)
GFR, EST AFRICAN AMERICAN: 48 mL/min — AB (ref >60)
GFR, EST AFRICAN AMERICAN: 40 mL/min — AB (ref >60)
GFR, EST NON AFRICAN AMERICAN: 41 mL/min — AB (ref >60)
GFR, EST NON AFRICAN AMERICAN: 41 mL/min — AB (ref >60)
GFR, EST NON AFRICAN AMERICAN: 47 mL/min — AB (ref >60)
Glucose, Bld: 171 mg/dL — ABNORMAL HIGH (ref 65–99)
Glucose, Bld: 167 mg/dL — ABNORMAL HIGH (ref 65–99)
Glucose, Bld: 225 mg/dL — ABNORMAL HIGH (ref 65–99)
Glucose, Bld: 342 mg/dL — ABNORMAL HIGH (ref 65–99)
POTASSIUM: 4 mmol/L (ref 3.5–5.1)
Potassium: 3.8 mmol/L (ref 3.5–5.1)
Potassium: 3.4 mmol/L — ABNORMAL LOW (ref 3.5–5.1)
Potassium: 4 mmol/L (ref 3.5–5.1)
SODIUM: 165 mmol/L — AB (ref 135–145)
SODIUM: 162 mmol/L — AB (ref 135–145)
Sodium: 169 mmol/L (ref 135–145)
Sodium: 167 mmol/L (ref 135–145)

## 2018-04-29 LAB — CBC WITH DIFFERENTIAL/PLATELET
Basophils Absolute: 0.3 10*3/uL — ABNORMAL HIGH (ref 0.0–0.1)
Basophils Relative: 1 %
EOS ABS: 0.3 10*3/uL (ref 0.0–0.7)
EOS PCT: 1 %
HEMATOCRIT: 26.8 % — AB (ref 39.0–52.0)
Hemoglobin: 7.8 g/dL — ABNORMAL LOW (ref 13.0–17.0)
LYMPHS PCT: 13 %
Lymphs Abs: 4.3 10*3/uL — ABNORMAL HIGH (ref 0.7–4.0)
MCH: 29.3 pg (ref 26.0–34.0)
MCHC: 29.1 g/dL — ABNORMAL LOW (ref 30.0–36.0)
MCV: 100.8 fL — ABNORMAL HIGH (ref 78.0–100.0)
MONO ABS: 2.6 10*3/uL — AB (ref 0.1–1.0)
Monocytes Relative: 8 %
NEUTROS PCT: 77 %
Neutro Abs: 25.2 10*3/uL — ABNORMAL HIGH (ref 1.7–7.7)
PLATELETS: 612 10*3/uL — AB (ref 150–400)
RBC: 2.66 MIL/uL — AB (ref 4.22–5.81)
RDW: 14.6 % (ref 11.5–15.5)
WBC: 32.7 10*3/uL — AB (ref 4.0–10.5)

## 2018-04-29 LAB — GLUCOSE, CAPILLARY
GLUCOSE-CAPILLARY: 155 mg/dL — AB (ref 65–99)
GLUCOSE-CAPILLARY: 172 mg/dL — AB (ref 65–99)
GLUCOSE-CAPILLARY: 177 mg/dL — AB (ref 65–99)
GLUCOSE-CAPILLARY: 128 mg/dL — AB (ref 65–99)
GLUCOSE-CAPILLARY: 325 mg/dL — AB (ref 65–99)
Glucose-Capillary: 179 mg/dL — ABNORMAL HIGH (ref 65–99)
Glucose-Capillary: 118 mg/dL — ABNORMAL HIGH (ref 65–99)
Glucose-Capillary: 139 mg/dL — ABNORMAL HIGH (ref 65–99)
Glucose-Capillary: 115 mg/dL — ABNORMAL HIGH (ref 65–99)
Glucose-Capillary: 142 mg/dL — ABNORMAL HIGH (ref 65–99)
Glucose-Capillary: 177 mg/dL — ABNORMAL HIGH (ref 65–99)
Glucose-Capillary: 290 mg/dL — ABNORMAL HIGH (ref 65–99)

## 2018-04-29 LAB — LACTIC ACID, PLASMA: LACTIC ACID, VENOUS: 1.2 mmol/L (ref 0.5–1.9)

## 2018-04-29 MED ORDER — VASOPRESSIN 20 UNIT/ML IV SOLN
0.0100 [IU]/min | INTRAVENOUS | Status: DC
  Administered 2018-04-29: 0.03 [IU]/min via INTRAVENOUS
  Filled 2018-04-29: qty 2

## 2018-04-29 MED ORDER — INSULIN ASPART 100 UNIT/ML ~~LOC~~ SOLN
2.0000 [IU] | SUBCUTANEOUS | Status: DC
  Administered 2018-04-29: 4 [IU] via SUBCUTANEOUS
  Administered 2018-04-29 (×2): 6 [IU] via SUBCUTANEOUS
  Administered 2018-04-29: 2 [IU] via SUBCUTANEOUS

## 2018-04-29 MED ORDER — SODIUM CHLORIDE 0.9 % IV SOLN
2.0000 g | Freq: Two times a day (BID) | INTRAVENOUS | Status: DC
  Filled 2018-04-29 (×2): qty 2

## 2018-04-29 MED ORDER — ALBUMIN HUMAN 5 % IV SOLN
25.0000 g | Freq: Once | INTRAVENOUS | Status: AC
  Administered 2018-04-29: 25 g via INTRAVENOUS
  Filled 2018-04-29: qty 500

## 2018-04-29 MED ORDER — TECHNETIUM TC 99M EXAMETAZIME IV KIT
10.0000 | PACK | Freq: Once | INTRAVENOUS | Status: AC | PRN
  Administered 2018-04-29: 10 via INTRAVENOUS

## 2018-04-29 MED ORDER — INSULIN DETEMIR 100 UNIT/ML ~~LOC~~ SOLN
5.0000 [IU] | Freq: Two times a day (BID) | SUBCUTANEOUS | Status: DC
  Administered 2018-04-29 (×2): 5 [IU] via SUBCUTANEOUS
  Filled 2018-04-29 (×3): qty 0.05

## 2018-04-29 MED ORDER — SODIUM CHLORIDE 0.45 % IV SOLN
INTRAVENOUS | Status: DC
  Administered 2018-04-29 – 2018-04-30 (×3): via INTRAVENOUS

## 2018-04-29 MED ORDER — INSULIN ASPART 100 UNIT/ML ~~LOC~~ SOLN
1.0000 [IU] | SUBCUTANEOUS | Status: DC
  Administered 2018-04-29 (×4): 1 [IU] via SUBCUTANEOUS

## 2018-04-29 MED ORDER — VANCOMYCIN HCL 10 G IV SOLR
1250.0000 mg | Freq: Two times a day (BID) | INTRAVENOUS | Status: DC
  Filled 2018-04-29 (×2): qty 1250

## 2018-04-29 MED ORDER — INSULIN DETEMIR 100 UNIT/ML ~~LOC~~ SOLN
5.0000 [IU] | Freq: Two times a day (BID) | SUBCUTANEOUS | Status: DC
  Filled 2018-04-29: qty 0.05

## 2018-04-29 MED ORDER — DEXTROSE 10 % IV SOLN: INTRAVENOUS | Status: DC | PRN

## 2018-04-29 MED ORDER — SODIUM CHLORIDE 0.45 % IV BOLUS
500.0000 mL | Freq: Once | INTRAVENOUS | Status: AC
  Administered 2018-04-29: 500 mL via INTRAVENOUS

## 2018-04-30 ENCOUNTER — Inpatient Hospital Stay (HOSPITAL_COMMUNITY): Payer: 59

## 2018-04-30 DIAGNOSIS — J9601 Acute respiratory failure with hypoxia: Secondary | ICD-10-CM

## 2018-04-30 LAB — CULTURE, RESPIRATORY W GRAM STAIN

## 2018-04-30 LAB — CBC WITH DIFFERENTIAL/PLATELET
BASOS ABS: 0.2 10*3/uL — AB (ref 0.0–0.1)
BASOS ABS: 0 10*3/uL (ref 0.0–0.1)
BASOS ABS: 0.2 10*3/uL — AB (ref 0.0–0.1)
BASOS PCT: 1 %
BASOS PCT: 1 %
Band Neutrophils: 4 %
Basophils Relative: 0 %
EOS ABS: 0.2 10*3/uL (ref 0.0–0.7)
EOS ABS: 0.5 10*3/uL (ref 0.0–0.7)
EOS ABS: 0 10*3/uL (ref 0.0–0.7)
EOS PCT: 3 %
Eosinophils Relative: 1 %
Eosinophils Relative: 0 %
HCT: 22.9 % — ABNORMAL LOW (ref 39.0–52.0)
HCT: 23.7 % — ABNORMAL LOW (ref 39.0–52.0)
HCT: 26.1 % — ABNORMAL LOW (ref 39.0–52.0)
HEMOGLOBIN: 7.5 g/dL — AB (ref 13.0–17.0)
Hemoglobin: 6.5 g/dL — CL (ref 13.0–17.0)
Hemoglobin: 6.6 g/dL — CL (ref 13.0–17.0)
LYMPHS ABS: 0.4 10*3/uL — AB (ref 0.7–4.0)
LYMPHS PCT: 2 %
LYMPHS PCT: 6 %
Lymphocytes Relative: 7 %
Lymphs Abs: 1.2 10*3/uL (ref 0.7–4.0)
Lymphs Abs: 1 10*3/uL (ref 0.7–4.0)
MCH: 29.4 pg (ref 26.0–34.0)
MCH: 29.1 pg (ref 26.0–34.0)
MCH: 29.4 pg (ref 26.0–34.0)
MCHC: 28.4 g/dL — AB (ref 30.0–36.0)
MCHC: 27.8 g/dL — AB (ref 30.0–36.0)
MCHC: 28.7 g/dL — ABNORMAL LOW (ref 30.0–36.0)
MCV: 103.6 fL — AB (ref 78.0–100.0)
MCV: 104.4 fL — ABNORMAL HIGH (ref 78.0–100.0)
MCV: 102.4 fL — ABNORMAL HIGH (ref 78.0–100.0)
MONO ABS: 1 10*3/uL (ref 0.1–1.0)
MONOS PCT: 0 %
MONOS PCT: 4 %
Metamyelocytes Relative: 3 %
Monocytes Absolute: 0 10*3/uL — ABNORMAL LOW (ref 0.1–1.0)
Monocytes Absolute: 0.7 10*3/uL (ref 0.1–1.0)
Monocytes Relative: 6 %
Myelocytes: 7 %
NEUTROS ABS: 14.5 10*3/uL — AB (ref 1.7–7.7)
NEUTROS ABS: 15.2 10*3/uL — AB (ref 1.7–7.7)
NEUTROS PCT: 85 %
NEUTROS PCT: 80 %
Neutro Abs: 16.6 10*3/uL — ABNORMAL HIGH (ref 1.7–7.7)
Neutrophils Relative %: 89 %
PLATELETS: 482 10*3/uL — AB (ref 150–400)
PLATELETS: 413 10*3/uL — AB (ref 150–400)
Platelets: 474 10*3/uL — ABNORMAL HIGH (ref 150–400)
Promyelocytes Relative: 1 %
RBC: 2.21 MIL/uL — ABNORMAL LOW (ref 4.22–5.81)
RBC: 2.27 MIL/uL — AB (ref 4.22–5.81)
RBC: 2.55 MIL/uL — ABNORMAL LOW (ref 4.22–5.81)
RDW: 15 % (ref 11.5–15.5)
RDW: 15 % (ref 11.5–15.5)
RDW: 17 % — ABNORMAL HIGH (ref 11.5–15.5)
WBC MORPHOLOGY: INCREASED
WBC: 17.1 10*3/uL — ABNORMAL HIGH (ref 4.0–10.5)
WBC: 17.5 10*3/uL — AB (ref 4.0–10.5)
WBC: 17.1 10*3/uL — ABNORMAL HIGH (ref 4.0–10.5)

## 2018-04-30 LAB — PROTIME-INR
INR: 1.46
Prothrombin Time: 17.6 seconds — ABNORMAL HIGH (ref 11.4–15.2)

## 2018-04-30 LAB — CK TOTAL AND CKMB (NOT AT ARMC)
CK TOTAL: 5310 U/L — AB (ref 49–397)
CK TOTAL: 6160 U/L — AB (ref 49–397)
CK, MB: 15.2 ng/mL — ABNORMAL HIGH (ref 0.5–5.0)
CK, MB: 13 ng/mL — ABNORMAL HIGH (ref 0.5–5.0)
Relative Index: 0.3 (ref 0.0–2.5)

## 2018-04-30 LAB — COMPREHENSIVE METABOLIC PANEL
ALBUMIN: 2.2 g/dL — AB (ref 3.5–5.0)
ALBUMIN: 1.9 g/dL — AB (ref 3.5–5.0)
ALK PHOS: 88 U/L (ref 38–126)
ALT: 90 U/L — AB (ref 17–63)
ALT: 91 U/L — ABNORMAL HIGH (ref 17–63)
ALT: 86 U/L — ABNORMAL HIGH (ref 17–63)
AST: 136 U/L — ABNORMAL HIGH (ref 15–41)
AST: 144 U/L — ABNORMAL HIGH (ref 15–41)
AST: 134 U/L — ABNORMAL HIGH (ref 15–41)
Albumin: 2.3 g/dL — ABNORMAL LOW (ref 3.5–5.0)
Alkaline Phosphatase: 89 U/L (ref 38–126)
Alkaline Phosphatase: 83 U/L (ref 38–126)
BILIRUBIN TOTAL: 1.9 mg/dL — AB (ref 0.3–1.2)
BILIRUBIN TOTAL: 1.7 mg/dL — AB (ref 0.3–1.2)
BUN: 42 mg/dL — ABNORMAL HIGH (ref 6–20)
BUN: 36 mg/dL — ABNORMAL HIGH (ref 6–20)
BUN: 34 mg/dL — ABNORMAL HIGH (ref 6–20)
CALCIUM: 8.4 mg/dL — AB (ref 8.9–10.3)
CO2: 29 mmol/L (ref 22–32)
CO2: 27 mmol/L (ref 22–32)
CO2: 27 mmol/L (ref 22–32)
Calcium: 8.6 mg/dL — ABNORMAL LOW (ref 8.9–10.3)
Calcium: 8.6 mg/dL — ABNORMAL LOW (ref 8.9–10.3)
Chloride: >130 mmol/L (ref 101–111)
Chloride: >130 mmol/L (ref 101–111)
Creatinine, Ser: 1.8 mg/dL — ABNORMAL HIGH (ref 0.61–1.24)
Creatinine, Ser: 1.78 mg/dL — ABNORMAL HIGH (ref 0.61–1.24)
Creatinine, Ser: 1.74 mg/dL — ABNORMAL HIGH (ref 0.61–1.24)
GFR calc Af Amer: 55 mL/min — ABNORMAL LOW (ref >60)
GFR calc non Af Amer: 46 mL/min — ABNORMAL LOW (ref >60)
GFR calc non Af Amer: 47 mL/min — ABNORMAL LOW (ref >60)
GFR, EST AFRICAN AMERICAN: 53 mL/min — AB (ref >60)
GFR, EST AFRICAN AMERICAN: 53 mL/min — AB (ref >60)
GFR, EST NON AFRICAN AMERICAN: 45 mL/min — AB (ref >60)
GLUCOSE: 364 mg/dL — AB (ref 65–99)
GLUCOSE: 322 mg/dL — AB (ref 65–99)
Glucose, Bld: 459 mg/dL — ABNORMAL HIGH (ref 65–99)
POTASSIUM: 3.4 mmol/L — AB (ref 3.5–5.1)
POTASSIUM: 3.1 mmol/L — AB (ref 3.5–5.1)
Potassium: 3.9 mmol/L (ref 3.5–5.1)
SODIUM: 174 mmol/L — AB (ref 135–145)
Sodium: 166 mmol/L (ref 135–145)
Sodium: 174 mmol/L (ref 135–145)
TOTAL PROTEIN: 6.5 g/dL (ref 6.5–8.1)
TOTAL PROTEIN: 6.6 g/dL (ref 6.5–8.1)
TOTAL PROTEIN: 6.4 g/dL — AB (ref 6.5–8.1)
Total Bilirubin: 2 mg/dL — ABNORMAL HIGH (ref 0.3–1.2)

## 2018-04-30 LAB — GLUCOSE, CAPILLARY
GLUCOSE-CAPILLARY: 404 mg/dL — AB (ref 65–99)
GLUCOSE-CAPILLARY: 284 mg/dL — AB (ref 65–99)
GLUCOSE-CAPILLARY: 277 mg/dL — AB (ref 65–99)
GLUCOSE-CAPILLARY: 263 mg/dL — AB (ref 65–99)
GLUCOSE-CAPILLARY: 277 mg/dL — AB (ref 65–99)
GLUCOSE-CAPILLARY: 291 mg/dL — AB (ref 65–99)
GLUCOSE-CAPILLARY: 316 mg/dL — AB (ref 65–99)
GLUCOSE-CAPILLARY: 337 mg/dL — AB (ref 65–99)
GLUCOSE-CAPILLARY: 355 mg/dL — AB (ref 65–99)
GLUCOSE-CAPILLARY: 296 mg/dL — AB (ref 65–99)
Glucose-Capillary: 263 mg/dL — ABNORMAL HIGH (ref 65–99)
Glucose-Capillary: 265 mg/dL — ABNORMAL HIGH (ref 65–99)
Glucose-Capillary: 287 mg/dL — ABNORMAL HIGH (ref 65–99)
Glucose-Capillary: 288 mg/dL — ABNORMAL HIGH (ref 65–99)
Glucose-Capillary: 300 mg/dL — ABNORMAL HIGH (ref 65–99)
Glucose-Capillary: 300 mg/dL — ABNORMAL HIGH (ref 65–99)
Glucose-Capillary: 315 mg/dL — ABNORMAL HIGH (ref 65–99)
Glucose-Capillary: 334 mg/dL — ABNORMAL HIGH (ref 65–99)
Glucose-Capillary: 350 mg/dL — ABNORMAL HIGH (ref 65–99)
Glucose-Capillary: 380 mg/dL — ABNORMAL HIGH (ref 65–99)
Glucose-Capillary: 392 mg/dL — ABNORMAL HIGH (ref 65–99)
Glucose-Capillary: 402 mg/dL — ABNORMAL HIGH (ref 65–99)
Glucose-Capillary: 404 mg/dL — ABNORMAL HIGH (ref 65–99)

## 2018-04-30 LAB — POCT I-STAT 3, ART BLOOD GAS (G3+)
ACID-BASE DEFICIT: 5 mmol/L — AB (ref 0.0–2.0)
ACID-BASE DEFICIT: 4 mmol/L — AB (ref 0.0–2.0)
Acid-base deficit: 2 mmol/L (ref 0.0–2.0)
Bicarbonate: 24.8 mmol/L (ref 20.0–28.0)
Bicarbonate: 20.6 mmol/L (ref 20.0–28.0)
Bicarbonate: 20.2 mmol/L (ref 20.0–28.0)
O2 SAT: 99 %
O2 Saturation: 93 %
O2 Saturation: 98 %
PCO2 ART: 56.2 mmHg — AB (ref 32.0–48.0)
PCO2 ART: 31.9 mmHg — AB (ref 32.0–48.0)
PH ART: 7.247 — AB (ref 7.350–7.450)
PH ART: 7.333 — AB (ref 7.350–7.450)
Patient temperature: 97
TCO2: 27 mmol/L (ref 22–32)
TCO2: 22 mmol/L (ref 22–32)
TCO2: 21 mmol/L — ABNORMAL LOW (ref 22–32)
pCO2 arterial: 38.3 mmHg (ref 32.0–48.0)
pH, Arterial: 7.411 (ref 7.350–7.450)
pO2, Arterial: 76 mmHg — ABNORMAL LOW (ref 83.0–108.0)
pO2, Arterial: 104 mmHg (ref 83.0–108.0)
pO2, Arterial: 114 mmHg — ABNORMAL HIGH (ref 83.0–108.0)

## 2018-04-30 LAB — BASIC METABOLIC PANEL
ANION GAP: 6 (ref 5–15)
BUN: 43 mg/dL — AB (ref 6–20)
BUN: 31 mg/dL — AB (ref 6–20)
CALCIUM: 8.5 mg/dL — AB (ref 8.9–10.3)
CALCIUM: 8.3 mg/dL — AB (ref 8.9–10.3)
CO2: 30 mmol/L (ref 22–32)
CO2: 23 mmol/L (ref 22–32)
CREATININE: 1.55 mg/dL — AB (ref 0.61–1.24)
Chloride: 130 mmol/L — ABNORMAL HIGH (ref 101–111)
Creatinine, Ser: 1.71 mg/dL — ABNORMAL HIGH (ref 0.61–1.24)
GFR calc Af Amer: 56 mL/min — ABNORMAL LOW (ref >60)
GFR calc Af Amer: >60 mL/min (ref >60)
GFR, EST NON AFRICAN AMERICAN: 48 mL/min — AB (ref >60)
GFR, EST NON AFRICAN AMERICAN: 54 mL/min — AB (ref >60)
GLUCOSE: 464 mg/dL — AB (ref 65–99)
Glucose, Bld: 476 mg/dL — ABNORMAL HIGH (ref 65–99)
POTASSIUM: 4.1 mmol/L (ref 3.5–5.1)
Potassium: 3 mmol/L — ABNORMAL LOW (ref 3.5–5.1)
SODIUM: 166 mmol/L — AB (ref 135–145)
SODIUM: 161 mmol/L — AB (ref 135–145)

## 2018-04-30 LAB — BLOOD GAS, ARTERIAL
Acid-Base Excess: 5.3 mmol/L — ABNORMAL HIGH (ref 0.0–2.0)
BICARBONATE: 30.2 mmol/L — AB (ref 20.0–28.0)
FIO2: 0.6
LHR: 32 {breaths}/min
O2 Saturation: 96.4 %
PATIENT TEMPERATURE: 98.6
PEEP/CPAP: 10 cmH2O
PO2 ART: 84.6 mmHg (ref 83.0–108.0)
VT: 490 mL
pCO2 arterial: 52.4 mmHg — ABNORMAL HIGH (ref 32.0–48.0)
pH, Arterial: 7.379 (ref 7.350–7.450)

## 2018-04-30 LAB — LIPASE, BLOOD
LIPASE: 58 U/L — AB (ref 11–51)
Lipase: 52 U/L — ABNORMAL HIGH (ref 11–51)

## 2018-04-30 LAB — CULTURE, RESPIRATORY

## 2018-04-30 LAB — PREPARE RBC (CROSSMATCH)

## 2018-04-30 LAB — BILIRUBIN, DIRECT
Bilirubin, Direct: 1.1 mg/dL — ABNORMAL HIGH (ref 0.1–0.5)
Bilirubin, Direct: 1.2 mg/dL — ABNORMAL HIGH (ref 0.1–0.5)
Bilirubin, Direct: 0.9 mg/dL — ABNORMAL HIGH (ref 0.1–0.5)

## 2018-04-30 LAB — URINALYSIS, ROUTINE W REFLEX MICROSCOPIC
Bilirubin Urine: NEGATIVE
GLUCOSE, UA: 150 mg/dL — AB
Ketones, ur: NEGATIVE mg/dL
Leukocytes, UA: NEGATIVE
NITRITE: NEGATIVE
Protein, ur: 30 mg/dL — AB
Specific Gravity, Urine: 1.006 (ref 1.005–1.030)
pH: 6 (ref 5.0–8.0)

## 2018-04-30 LAB — MAGNESIUM: Magnesium: 2.7 mg/dL — ABNORMAL HIGH (ref 1.7–2.4)

## 2018-04-30 LAB — TROPONIN I
TROPONIN I: 0.17 ng/mL — AB (ref <0.03)
Troponin I: 0.12 ng/mL (ref <0.03)

## 2018-04-30 LAB — AMYLASE
AMYLASE: 28 U/L (ref 28–100)
Amylase: 28 U/L (ref 28–100)

## 2018-04-30 LAB — HEMOGLOBIN A1C
Hgb A1c MFr Bld: 5.9 % — ABNORMAL HIGH (ref 4.8–5.6)
Mean Plasma Glucose: 122.63 mg/dL

## 2018-04-30 LAB — FIBRINOGEN: Fibrinogen: 743 mg/dL — ABNORMAL HIGH (ref 210–475)

## 2018-04-30 LAB — PHOSPHORUS: PHOSPHORUS: 2.5 mg/dL (ref 2.5–4.6)

## 2018-04-30 MED ORDER — DEXTROSE 5 % AND 0.45 % NACL IV BOLUS
100.0000 mL | Freq: Once | INTRAVENOUS | Status: AC
  Administered 2018-04-30: 100 mL via INTRAVENOUS

## 2018-04-30 MED ORDER — SODIUM CHLORIDE 0.9 % IV BOLUS
625.0000 mL | Freq: Once | INTRAVENOUS | Status: AC
  Administered 2018-04-30: 625 mL via INTRAVENOUS

## 2018-04-30 MED ORDER — INSULIN ASPART 100 UNIT/ML ~~LOC~~ SOLN
20.0000 [IU] | Freq: Once | SUBCUTANEOUS | Status: AC
  Administered 2018-04-30: 20 [IU] via SUBCUTANEOUS

## 2018-04-30 MED ORDER — SODIUM CHLORIDE 0.45 % IV BOLUS: 625.0000 mL | Freq: Once | INTRAVENOUS | Status: DC

## 2018-04-30 MED ORDER — SODIUM CHLORIDE 0.9 % IV BOLUS
225.0000 mL | Freq: Once | INTRAVENOUS | Status: AC
  Administered 2018-04-30: 225 mL via INTRAVENOUS

## 2018-04-30 MED ORDER — DEXTROSE 5 % AND 0.45 % NACL IV BOLUS
150.0000 mL | Freq: Once | INTRAVENOUS | Status: AC
  Administered 2018-04-30: 150 mL via INTRAVENOUS

## 2018-04-30 MED ORDER — DEXTROSE 5 % AND 0.45 % NACL IV BOLUS
125.0000 mL | Freq: Once | INTRAVENOUS | Status: AC
Start: 2018-04-30 — End: 2018-04-30
  Administered 2018-04-30: 125 mL via INTRAVENOUS

## 2018-04-30 MED ORDER — ALBUTEROL SULFATE (2.5 MG/3ML) 0.083% IN NEBU: 2.5000 mg | INHALATION_SOLUTION | RESPIRATORY_TRACT | Status: DC | PRN

## 2018-04-30 MED ORDER — LACTATED RINGERS IV SOLN
INTRAVENOUS | Status: DC
  Administered 2018-04-30: 150 mL via INTRAVENOUS
  Administered 2018-04-30: 02:00:00 via INTRAVENOUS

## 2018-04-30 MED ORDER — VASOPRESSIN 20 UNIT/ML IV SOLN
0.5000 [IU]/h | INTRAVENOUS | Status: DC
  Administered 2018-04-30: 0.5 [IU]/h via INTRAVENOUS
  Filled 2018-04-30 (×2): qty 1

## 2018-04-30 MED ORDER — SODIUM CHLORIDE 0.9 % IV SOLN
2000.0000 mg | Freq: Once | INTRAVENOUS | Status: AC
  Administered 2018-04-30: 2000 mg via INTRAVENOUS
  Filled 2018-04-30: qty 16

## 2018-04-30 MED ORDER — SODIUM CHLORIDE 0.9 % IV BOLUS
475.0000 mL | Freq: Once | INTRAVENOUS | Status: AC
  Administered 2018-04-30: 475 mL via INTRAVENOUS

## 2018-04-30 MED ORDER — NOREPINEPHRINE 16 MG/250ML-% IV SOLN
0.0000 ug/min | INTRAVENOUS | Status: DC
  Administered 2018-04-30: 9 ug/min via INTRAVENOUS
  Administered 2018-04-30: 10 ug/min via INTRAVENOUS
  Filled 2018-04-30: qty 250

## 2018-04-30 MED ORDER — SODIUM CHLORIDE 0.9 % IV SOLN
2.0000 g | Freq: Two times a day (BID) | INTRAVENOUS | Status: DC
  Administered 2018-04-30: 2 g via INTRAVENOUS
  Filled 2018-04-30 (×2): qty 2

## 2018-04-30 MED ORDER — FREE WATER
100.0000 mL | Status: DC
  Administered 2018-04-30 (×4): 100 mL

## 2018-04-30 MED ORDER — FLUCONAZOLE IN SODIUM CHLORIDE 400-0.9 MG/200ML-% IV SOLN
400.0000 mg | Freq: Once | INTRAVENOUS | Status: AC
  Administered 2018-04-30: 400 mg via INTRAVENOUS
  Filled 2018-04-30: qty 200

## 2018-04-30 MED ORDER — ALBUMIN HUMAN 5 % IV SOLN
25.0000 g | Freq: Once | INTRAVENOUS | Status: AC
  Administered 2018-04-30: 25 g via INTRAVENOUS
  Filled 2018-04-30: qty 250

## 2018-04-30 MED ORDER — SODIUM CHLORIDE 0.9% FLUSH: 10.0000 mL | INTRAVENOUS | Status: DC | PRN

## 2018-04-30 MED ORDER — IPRATROPIUM BROMIDE 0.02 % IN SOLN: 0.5000 mg | RESPIRATORY_TRACT | Status: DC | PRN

## 2018-04-30 MED ORDER — SODIUM CHLORIDE 0.9 % IV BOLUS
1000.0000 mL | Freq: Once | INTRAVENOUS | Status: AC
  Administered 2018-04-30: 1000 mL via INTRAVENOUS

## 2018-04-30 MED ORDER — FREE WATER
300.0000 mL | Status: DC
  Administered 2018-04-30: 300 mL

## 2018-04-30 MED ORDER — DEXTROSE 5 % IV SOLN
INTRAVENOUS | Status: DC
  Administered 2018-04-30 – 2018-05-01 (×2): via INTRAVENOUS
  Filled 2018-04-30 (×8): qty 1000

## 2018-04-30 MED ORDER — SODIUM CHLORIDE 0.9 % IV SOLN
Freq: Once | INTRAVENOUS | Status: AC
  Administered 2018-04-30: 10 mL via INTRAVENOUS

## 2018-04-30 MED ORDER — SODIUM CHLORIDE 0.9 % IV SOLN
INTRAVENOUS | Status: DC
  Administered 2018-04-30: 49.6 [IU]/h via INTRAVENOUS
  Administered 2018-04-30: 3.4 [IU]/h via INTRAVENOUS
  Administered 2018-04-30: 30.4 [IU]/h via INTRAVENOUS
  Administered 2018-05-01: 34.7 [IU]/h via INTRAVENOUS
  Filled 2018-04-30 (×10): qty 1

## 2018-04-30 MED ORDER — DEXTROSE 50 % IV SOLN: 1.0000 | Freq: Once | INTRAVENOUS | Status: DC

## 2018-04-30 MED ORDER — DEXTROSE 5 % IV SOLN
Freq: Once | INTRAVENOUS | Status: AC
  Administered 2018-04-30: 20:00:00 via INTRAVENOUS

## 2018-04-30 MED ORDER — DEXTROSE 5 % IV BOLUS
85.0000 mL | Freq: Once | INTRAVENOUS | Status: AC
  Administered 2018-04-30: 85 mL via INTRAVENOUS

## 2018-04-30 MED ORDER — DEXTROSE 5 % IV BOLUS
125.0000 mL | Freq: Once | INTRAVENOUS | Status: AC
  Administered 2018-04-30: 125 mL via INTRAVENOUS

## 2018-04-30 MED ORDER — ALBUMIN HUMAN 5 % IV SOLN
12.5000 g | Freq: Once | INTRAVENOUS | Status: AC
  Administered 2018-04-30: 12.5 g via INTRAVENOUS
  Filled 2018-04-30: qty 250

## 2018-04-30 MED ORDER — DEXTROSE 5 % IV BOLUS
100.0000 mL | Freq: Once | INTRAVENOUS | Status: AC
  Administered 2018-04-30: 100 mL via INTRAVENOUS

## 2018-04-30 MED ORDER — DEXTROSE-NACL 5-0.45 % IV SOLN
INTRAVENOUS | Status: DC
  Administered 2018-04-30: 150 mL via INTRAVENOUS

## 2018-04-30 MED ORDER — FREE WATER
150.0000 mL | Status: DC
  Administered 2018-04-30 – 2018-05-01 (×8): 150 mL

## 2018-04-30 MED ORDER — SODIUM CHLORIDE 0.9% FLUSH: 10.0000 mL | Freq: Two times a day (BID) | INTRAVENOUS | Status: DC

## 2018-04-30 MED ORDER — DEXTROSE 5 % AND 0.45 % NACL IV BOLUS
125.0000 mL | Freq: Once | INTRAVENOUS | Status: AC
  Administered 2018-04-30: 125 mL via INTRAVENOUS

## 2018-04-30 MED ORDER — FREE WATER
200.0000 mL | Status: DC
  Administered 2018-04-30 (×2): 200 mL

## 2018-04-30 MED ORDER — FREE WATER
300.0000 mL | Status: DC
  Administered 2018-04-30 (×2): 300 mL

## 2018-04-30 MED ORDER — DEXTROSE 5 % IV BOLUS
60.0000 mL | Freq: Once | INTRAVENOUS | Status: AC
  Administered 2018-04-30: 60 mL via INTRAVENOUS

## 2018-04-30 MED ORDER — VANCOMYCIN HCL 10 G IV SOLR
1250.0000 mg | Freq: Two times a day (BID) | INTRAVENOUS | Status: DC
  Administered 2018-04-30: 1250 mg via INTRAVENOUS
  Filled 2018-04-30: qty 1250

## 2018-04-30 MED ORDER — DEXTROSE 5 % IV SOLN: INTRAVENOUS | Status: DC

## 2018-04-30 MED ORDER — SODIUM CHLORIDE 0.9 % IV SOLN
10.0000 ug/h | INTRAVENOUS | Status: DC
  Administered 2018-04-30 – 2018-05-01 (×3): 20 ug/h via INTRAVENOUS
  Administered 2018-05-01: 15:00:00 via INTRAVENOUS
  Filled 2018-04-30 (×4): qty 10

## 2018-04-30 MED ORDER — CIPROFLOXACIN IN D5W 400 MG/200ML IV SOLN
400.0000 mg | Freq: Once | INTRAVENOUS | Status: AC
  Administered 2018-04-30: 400 mg via INTRAVENOUS
  Filled 2018-04-30: qty 200

## 2018-04-30 MED ORDER — SODIUM CHLORIDE 0.9 % IV BOLUS
700.0000 mL | Freq: Once | INTRAVENOUS | Status: AC
  Administered 2018-04-30: 700 mL via INTRAVENOUS

## 2018-04-30 MED ORDER — SODIUM CHLORIDE 0.9 % IV BOLUS
350.0000 mL | Freq: Once | INTRAVENOUS | Status: AC
  Administered 2018-04-30: 350 mL via INTRAVENOUS

## 2018-04-30 MED ORDER — LEVOTHYROXINE SODIUM 100 MCG IV SOLR
20.0000 ug | Freq: Once | INTRAVENOUS | Status: AC
  Administered 2018-04-30: 20 ug via INTRAVENOUS
  Filled 2018-04-30: qty 5

## 2018-04-30 MED ORDER — CHLORHEXIDINE GLUCONATE CLOTH 2 % EX PADS
6.0000 | MEDICATED_PAD | Freq: Every day | CUTANEOUS | Status: DC
  Administered 2018-04-30: 6 via TOPICAL

## 2018-04-30 MED ORDER — POTASSIUM CHLORIDE 10 MEQ/50ML IV SOLN
10.0000 meq | INTRAVENOUS | Status: AC
  Administered 2018-04-30 – 2018-05-01 (×4): 10 meq via INTRAVENOUS
  Filled 2018-04-30 (×4): qty 50

## 2018-04-30 NOTE — Progress Notes (Signed)
Verbal orders from CDS [facility](Melanie Combs):  1.Increase free water flushes to 300 ml q1h 2. Change 1:1 urine replacement fluid from D5 1/4NS to D5W 3. Decrease vasopressin 50% from 0.5 to 0.25 mcg/hr

## 2018-04-30 NOTE — Progress Notes (Signed)
CRITICAL VALUE ALERT  Critical Value:Na 174 & Cl >130  Date & Time Notied:04/30/18 1525  Provider Notified:CDS team  Orders Received/Actions taken:1 L bolus D5 1/4, 56100ml/hr

## 2018-04-30 NOTE — Progress Notes (Signed)
Verbal orders from CDS [facility](Melanie Combs):  1.Give 25g of albumin 2. Change free water flushes from 300 mL q1h, to 300 mL q2h 3.Give 1L D5W + 40 mEq of Potassiumn 57100mL/hr

## 2018-04-30 NOTE — Progress Notes (Signed)
Chest pt not done at this time due to the bed on Rotation & Pulse.

## 2018-04-30 NOTE — Progress Notes (Signed)
  Echocardiogram 2D Echocardiogram has been performed.  Roosvelt MaserLane, Loyal Holzheimer F 04/30/2018, 2:03 PM

## 2018-04-30 NOTE — Progress Notes (Signed)
CPT not done at this time.  Bed is on rotation.

## 2018-04-30 NOTE — Progress Notes (Signed)
Per CDS - vent changes per MD order.
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
## 2018-04-30 NOTE — Progress Notes (Signed)
CRITICAL VALUE ALERT  Critical Value:  Na 174 & Cl 130  Date & Time Notied:  04/30/18 1000  Provider Notified: Fredonia HighlandWilson, E. & CDS team  Orders Received/Actions taken: D/C 0.9% NS @ 14250ml/hr and start D5 0.45% NS @ 15750ml/hr. Increase 100mL free water flushes to 200mL q2h.

## 2018-05-01 ENCOUNTER — Inpatient Hospital Stay (HOSPITAL_COMMUNITY): Payer: 59 | Admitting: Anesthesiology

## 2018-05-01 ENCOUNTER — Inpatient Hospital Stay (HOSPITAL_COMMUNITY): Payer: 59

## 2018-05-01 ENCOUNTER — Ambulatory Visit (HOSPITAL_COMMUNITY): Admission: EM | Disposition: E | Payer: Self-pay | Source: Home / Self Care

## 2018-05-01 ENCOUNTER — Encounter (HOSPITAL_COMMUNITY): Admission: EM | Disposition: E | Payer: Self-pay | Source: Home / Self Care

## 2018-05-01 DIAGNOSIS — Z5289 Donor of other specified organs or tissues: Secondary | ICD-10-CM

## 2018-05-01 DIAGNOSIS — Z529 Donor of unspecified organ or tissue: Secondary | ICD-10-CM

## 2018-05-01 DIAGNOSIS — R079 Chest pain, unspecified: Secondary | ICD-10-CM

## 2018-05-01 HISTORY — PX: ORGAN PROCUREMENT: SHX5270

## 2018-05-01 HISTORY — PX: LEFT HEART CATH AND CORONARY ANGIOGRAPHY: CATH118249

## 2018-05-01 HISTORY — PX: RIGHT/LEFT HEART CATH AND CORONARY ANGIOGRAPHY: CATH118266

## 2018-05-01 LAB — COMPREHENSIVE METABOLIC PANEL
ALBUMIN: 2.3 g/dL — AB (ref 3.5–5.0)
ALK PHOS: 78 U/L (ref 38–126)
ALT: 79 U/L — ABNORMAL HIGH (ref 17–63)
ALT: 76 U/L — AB (ref 17–63)
AST: 102 U/L — AB (ref 15–41)
AST: 106 U/L — ABNORMAL HIGH (ref 15–41)
Albumin: 2.1 g/dL — ABNORMAL LOW (ref 3.5–5.0)
Alkaline Phosphatase: 87 U/L (ref 38–126)
Anion gap: 3 — ABNORMAL LOW (ref 5–15)
BILIRUBIN TOTAL: 1.2 mg/dL (ref 0.3–1.2)
BUN: 29 mg/dL — AB (ref 6–20)
BUN: 30 mg/dL — ABNORMAL HIGH (ref 6–20)
CALCIUM: 8.6 mg/dL — AB (ref 8.9–10.3)
CHLORIDE: 129 mmol/L — AB (ref 101–111)
CO2: 22 mmol/L (ref 22–32)
CO2: 26 mmol/L (ref 22–32)
CREATININE: 1.46 mg/dL — AB (ref 0.61–1.24)
Calcium: 8.3 mg/dL — ABNORMAL LOW (ref 8.9–10.3)
Creatinine, Ser: 1.64 mg/dL — ABNORMAL HIGH (ref 0.61–1.24)
GFR calc Af Amer: 59 mL/min — ABNORMAL LOW (ref >60)
GFR calc non Af Amer: 51 mL/min — ABNORMAL LOW (ref >60)
GFR, EST NON AFRICAN AMERICAN: 59 mL/min — AB (ref >60)
GLUCOSE: 197 mg/dL — AB (ref 65–99)
Glucose, Bld: 304 mg/dL — ABNORMAL HIGH (ref 65–99)
Potassium: 3.1 mmol/L — ABNORMAL LOW (ref 3.5–5.1)
Potassium: 5.5 mmol/L — ABNORMAL HIGH (ref 3.5–5.1)
SODIUM: 161 mmol/L — AB (ref 135–145)
Sodium: 158 mmol/L — ABNORMAL HIGH (ref 135–145)
Total Bilirubin: 1.3 mg/dL — ABNORMAL HIGH (ref 0.3–1.2)
Total Protein: 6.1 g/dL — ABNORMAL LOW (ref 6.5–8.1)
Total Protein: 6.6 g/dL (ref 6.5–8.1)

## 2018-05-01 LAB — PROTIME-INR
INR: 1.46
PROTHROMBIN TIME: 17.6 s — AB (ref 11.4–15.2)

## 2018-05-01 LAB — FIBRINOGEN: FIBRINOGEN: 732 mg/dL — AB (ref 210–475)

## 2018-05-01 LAB — GLUCOSE, CAPILLARY
GLUCOSE-CAPILLARY: 274 mg/dL — AB (ref 65–99)
GLUCOSE-CAPILLARY: 215 mg/dL — AB (ref 65–99)
GLUCOSE-CAPILLARY: 211 mg/dL — AB (ref 65–99)
GLUCOSE-CAPILLARY: 187 mg/dL — AB (ref 65–99)
GLUCOSE-CAPILLARY: 158 mg/dL — AB (ref 65–99)
GLUCOSE-CAPILLARY: 132 mg/dL — AB (ref 65–99)
GLUCOSE-CAPILLARY: 110 mg/dL — AB (ref 65–99)
Glucose-Capillary: 116 mg/dL — ABNORMAL HIGH (ref 65–99)
Glucose-Capillary: 112 mg/dL — ABNORMAL HIGH (ref 65–99)
Glucose-Capillary: 149 mg/dL — ABNORMAL HIGH (ref 65–99)
Glucose-Capillary: 151 mg/dL — ABNORMAL HIGH (ref 65–99)
Glucose-Capillary: 138 mg/dL — ABNORMAL HIGH (ref 65–99)
Glucose-Capillary: 142 mg/dL — ABNORMAL HIGH (ref 65–99)

## 2018-05-01 LAB — URINALYSIS, ROUTINE W REFLEX MICROSCOPIC
Bilirubin Urine: NEGATIVE
Glucose, UA: NEGATIVE mg/dL
KETONES UR: NEGATIVE mg/dL
Leukocytes, UA: NEGATIVE
Nitrite: NEGATIVE
PH: 6 (ref 5.0–8.0)
Protein, ur: 100 mg/dL — AB
SPECIFIC GRAVITY, URINE: 1.04 — AB (ref 1.005–1.030)

## 2018-05-01 LAB — CBC
HCT: 25.1 % — ABNORMAL LOW (ref 39.0–52.0)
HEMATOCRIT: 28.4 % — AB (ref 39.0–52.0)
HEMOGLOBIN: 7.4 g/dL — AB (ref 13.0–17.0)
Hemoglobin: 8.4 g/dL — ABNORMAL LOW (ref 13.0–17.0)
MCH: 29.2 pg (ref 26.0–34.0)
MCH: 29.8 pg (ref 26.0–34.0)
MCHC: 29.5 g/dL — ABNORMAL LOW (ref 30.0–36.0)
MCHC: 29.6 g/dL — ABNORMAL LOW (ref 30.0–36.0)
MCV: 100.7 fL — ABNORMAL HIGH (ref 78.0–100.0)
MCV: 99.2 fL (ref 78.0–100.0)
PLATELETS: 418 10*3/uL — AB (ref 150–400)
Platelets: 436 10*3/uL — ABNORMAL HIGH (ref 150–400)
RBC: 2.82 MIL/uL — ABNORMAL LOW (ref 4.22–5.81)
RBC: 2.53 MIL/uL — AB (ref 4.22–5.81)
RDW: 17.1 % — ABNORMAL HIGH (ref 11.5–15.5)
RDW: 17.3 % — AB (ref 11.5–15.5)
WBC: 19.3 10*3/uL — ABNORMAL HIGH (ref 4.0–10.5)
WBC: 18.2 10*3/uL — AB (ref 4.0–10.5)

## 2018-05-01 LAB — POCT I-STAT 3, ART BLOOD GAS (G3+)
ACID-BASE DEFICIT: 2 mmol/L (ref 0.0–2.0)
BICARBONATE: 25.6 mmol/L (ref 20.0–28.0)
O2 Saturation: 100 %
PO2 ART: 326 mmHg — AB (ref 83.0–108.0)
Patient temperature: 36.6
TCO2: 27 mmol/L (ref 22–32)
pCO2 arterial: 60.5 mmHg — ABNORMAL HIGH (ref 32.0–48.0)
pH, Arterial: 7.233 — ABNORMAL LOW (ref 7.350–7.450)

## 2018-05-01 LAB — POCT I-STAT 7, (LYTES, BLD GAS, ICA,H+H)
ACID-BASE EXCESS: 1 mmol/L (ref 0.0–2.0)
Bicarbonate: 24.6 mmol/L (ref 20.0–28.0)
CALCIUM ION: 1.23 mmol/L (ref 1.15–1.40)
HEMATOCRIT: 22 % — AB (ref 39.0–52.0)
HEMOGLOBIN: 7.5 g/dL — AB (ref 13.0–17.0)
O2 Saturation: 100 %
POTASSIUM: 3.9 mmol/L (ref 3.5–5.1)
Patient temperature: 35.5
SODIUM: 161 mmol/L — AB (ref 135–145)
TCO2: 26 mmol/L (ref 22–32)
pCO2 arterial: 33.3 mmHg (ref 32.0–48.0)
pH, Arterial: 7.47 — ABNORMAL HIGH (ref 7.350–7.450)
pO2, Arterial: 451 mmHg — ABNORMAL HIGH (ref 83.0–108.0)

## 2018-05-01 LAB — CREATININE, SERUM
Creatinine, Ser: 1.86 mg/dL — ABNORMAL HIGH (ref 0.61–1.24)
GFR, EST AFRICAN AMERICAN: 51 mL/min — AB (ref >60)
GFR, EST NON AFRICAN AMERICAN: 44 mL/min — AB (ref >60)

## 2018-05-01 LAB — PHOSPHORUS: Phosphorus: 3.5 mg/dL (ref 2.5–4.6)

## 2018-05-01 LAB — CK TOTAL AND CKMB (NOT AT ARMC)
CK, MB: 14.4 ng/mL — AB (ref 0.5–5.0)
Relative Index: 0.4 (ref 0.0–2.5)
Total CK: 3750 U/L — ABNORMAL HIGH (ref 49–397)

## 2018-05-01 LAB — APTT: APTT: 26 s (ref 24–36)

## 2018-05-01 LAB — MAGNESIUM: MAGNESIUM: 2.2 mg/dL (ref 1.7–2.4)

## 2018-05-01 LAB — PREPARE RBC (CROSSMATCH)

## 2018-05-01 SURGERY — LEFT HEART CATH AND CORONARY ANGIOGRAPHY
Anesthesia: LOCAL

## 2018-05-01 SURGERY — SURGICAL PROCUREMENT, ORGAN
Anesthesia: General

## 2018-05-01 MED ORDER — LIDOCAINE HCL (PF) 1 % IJ SOLN
INTRAMUSCULAR | Status: AC
  Filled 2018-05-01: qty 30

## 2018-05-01 MED ORDER — HEPARIN SODIUM (PORCINE) 1000 UNIT/ML IJ SOLN
INTRAMUSCULAR | Status: DC | PRN
  Administered 2018-05-01: 35500 [IU] via INTRAVENOUS

## 2018-05-01 MED ORDER — HEPARIN (PORCINE) IN NACL 2-0.9 UNITS/ML
INTRAMUSCULAR | Status: AC | PRN
  Administered 2018-05-01: 1000 mL

## 2018-05-01 MED ORDER — PIPERACILLIN-TAZOBACTAM 3.375 G IVPB 30 MIN
3.3750 g | Freq: Once | INTRAVENOUS | Status: AC
  Administered 2018-05-01: 3.375 g via INTRAVENOUS
  Filled 2018-05-01: qty 50

## 2018-05-01 MED ORDER — SODIUM CHLORIDE 0.9% FLUSH: 3.0000 mL | Freq: Two times a day (BID) | INTRAVENOUS | Status: DC

## 2018-05-01 MED ORDER — IOPAMIDOL (ISOVUE-370) INJECTION 76%
INTRAVENOUS | Status: AC
  Filled 2018-05-01: qty 125

## 2018-05-01 MED ORDER — HEPARIN (PORCINE) IN NACL 1000-0.9 UT/500ML-% IV SOLN
INTRAVENOUS | Status: AC
  Filled 2018-05-01: qty 1000

## 2018-05-01 MED ORDER — SODIUM CHLORIDE 0.9% FLUSH: 3.0000 mL | INTRAVENOUS | Status: DC | PRN

## 2018-05-01 MED ORDER — FUROSEMIDE 10 MG/ML IJ SOLN
10.0000 mg | Freq: Once | INTRAMUSCULAR | Status: AC
  Administered 2018-05-01: 10 mg via INTRAVENOUS
  Filled 2018-05-01: qty 2

## 2018-05-01 MED ORDER — POTASSIUM CHLORIDE 20 MEQ/15ML (10%) PO SOLN
40.0000 meq | Freq: Once | ORAL | Status: AC
Start: 2018-05-01 — End: 2018-05-01
  Administered 2018-05-01: 40 meq via ORAL
  Filled 2018-05-01 (×2): qty 30

## 2018-05-01 MED ORDER — VERAPAMIL HCL 2.5 MG/ML IV SOLN
INTRAVENOUS | Status: AC
  Filled 2018-05-01: qty 2

## 2018-05-01 MED ORDER — PHENYLEPHRINE 40 MCG/ML (10ML) SYRINGE FOR IV PUSH (FOR BLOOD PRESSURE SUPPORT)
PREFILLED_SYRINGE | INTRAVENOUS | Status: DC | PRN
  Administered 2018-05-01 (×2): 120 ug via INTRAVENOUS
  Administered 2018-05-01: 160 ug via INTRAVENOUS

## 2018-05-01 MED ORDER — POTASSIUM CHLORIDE 20 MEQ PO PACK: 40.0000 meq | PACK | Freq: Once | ORAL | Status: DC

## 2018-05-01 MED ORDER — SODIUM CHLORIDE 0.9 % IV SOLN
Freq: Once | INTRAVENOUS | Status: AC
  Administered 2018-05-01: 10 mL via INTRAVENOUS

## 2018-05-01 MED ORDER — FUROSEMIDE 10 MG/ML IJ SOLN
10.0000 mg | Freq: Once | INTRAMUSCULAR | Status: AC
  Administered 2018-05-01: 10 mg via INTRAVENOUS

## 2018-05-01 MED ORDER — DEXTROSE 5 % AND 0.45 % NACL IV BOLUS
250.0000 mL | Freq: Once | INTRAVENOUS | Status: DC
  Administered 2018-05-01: 250 mL via INTRAVENOUS

## 2018-05-01 MED ORDER — DEXTROSE-NACL 5-0.45 % IV SOLN
INTRAVENOUS | Status: DC
  Administered 2018-05-01: 1 mL via INTRAVENOUS

## 2018-05-01 MED ORDER — ROCURONIUM BROMIDE 10 MG/ML (PF) SYRINGE
PREFILLED_SYRINGE | INTRAVENOUS | Status: DC | PRN
  Administered 2018-05-01: 50 mg via INTRAVENOUS
  Administered 2018-05-01: 100 mg via INTRAVENOUS

## 2018-05-01 MED ORDER — ALBUMIN HUMAN 5 % IV SOLN
INTRAVENOUS | Status: DC | PRN
  Administered 2018-05-01: 15:00:00 via INTRAVENOUS

## 2018-05-01 MED ORDER — SODIUM CHLORIDE 0.9 % IV SOLN
250.0000 mL | INTRAVENOUS | Status: DC | PRN
  Administered 2018-05-01: 14:00:00 via INTRAVENOUS

## 2018-05-01 MED ORDER — CEFAZOLIN SODIUM-DEXTROSE 2-4 GM/100ML-% IV SOLN
2.0000 g | Freq: Once | INTRAVENOUS | Status: AC
  Administered 2018-05-01: 2 g via INTRAVENOUS
  Filled 2018-05-01: qty 100

## 2018-05-01 MED ORDER — SODIUM CHLORIDE 0.9 % IR SOLN
Status: DC | PRN
  Administered 2018-05-01: 4000 mL
  Administered 2018-05-01: 3000 mL

## 2018-05-01 MED ORDER — LIDOCAINE HCL (PF) 1 % IJ SOLN
INTRAMUSCULAR | Status: DC | PRN
  Administered 2018-05-01: 17 mL

## 2018-05-01 MED ORDER — DEXTROSE 5 % AND 0.45 % NACL IV BOLUS
250.0000 mL | Freq: Once | INTRAVENOUS | Status: AC
  Administered 2018-05-01: 250 mL via INTRAVENOUS

## 2018-05-01 MED ORDER — ACETAMINOPHEN 325 MG PO TABS: 650.0000 mg | ORAL_TABLET | ORAL | Status: DC | PRN

## 2018-05-01 MED ORDER — VASOPRESSIN 20 UNIT/ML IV SOLN
INTRAVENOUS | Status: AC
  Filled 2018-05-01: qty 1

## 2018-05-01 MED ORDER — IOPAMIDOL (ISOVUE-370) INJECTION 76%
INTRAVENOUS | Status: DC | PRN
  Administered 2018-05-01: 85 mL via INTRA_ARTERIAL

## 2018-05-01 MED ORDER — ONDANSETRON HCL 4 MG/2ML IJ SOLN
4.0000 mg | Freq: Four times a day (QID) | INTRAMUSCULAR | Status: DC | PRN
Start: 2018-05-01 — End: 2018-05-01

## 2018-05-01 SURGICAL SUPPLY — 53 items
APPLIER CLIP 11 MED OPEN (CLIP) ×2
APR CLP MED 11 20 MLT OPN (CLIP) ×1
BLADE CLIPPER SURG (BLADE) ×2 IMPLANT
BLADE STERNUM SYSTEM 6 (BLADE) ×2 IMPLANT
BLADE SURG 11 STRL SS (BLADE) ×2 IMPLANT
CLIP APPLIE 11 MED OPEN (CLIP) ×1 IMPLANT
CLIP VESOCCLUDE MED 24/CT (CLIP) ×4 IMPLANT
CLIP VESOCCLUDE SM WIDE 24/CT (CLIP) ×4 IMPLANT
CONT SPEC 4OZ CLIKSEAL STRL BL (MISCELLANEOUS) ×4 IMPLANT
COVER BACK TABLE 60X90IN (DRAPES) ×2 IMPLANT
COVER MAYO STAND STRL (DRAPES) IMPLANT
COVER SURGICAL LIGHT HANDLE (MISCELLANEOUS) ×2 IMPLANT
DRAPE SLUSH MACHINE 52X66 (DRAPES) ×4 IMPLANT
DRSG COVADERM 4X10 (GAUZE/BANDAGES/DRESSINGS) IMPLANT
DRSG COVADERM 4X14 (GAUZE/BANDAGES/DRESSINGS) ×6 IMPLANT
DRSG COVADERM 4X8 (GAUZE/BANDAGES/DRESSINGS) ×2 IMPLANT
DRSG TELFA 3X8 NADH (GAUZE/BANDAGES/DRESSINGS) ×2 IMPLANT
DURAPREP 26ML APPLICATOR (WOUND CARE) ×4 IMPLANT
ELECT BLADE 6.5 EXT (BLADE) ×2 IMPLANT
ELECT REM PT RETURN 9FT ADLT (ELECTROSURGICAL) ×4
ELECTRODE REM PT RTRN 9FT ADLT (ELECTROSURGICAL) ×2 IMPLANT
GOWN STRL REUS W/ TWL LRG LVL3 (GOWN DISPOSABLE) ×6 IMPLANT
GOWN STRL REUS W/TWL LRG LVL3 (GOWN DISPOSABLE) ×12
KIT POST MORTEM ADULT 36X90 (BAG) ×2 IMPLANT
KIT TURNOVER KIT B (KITS) ×2 IMPLANT
LOOP VESSEL MAXI BLUE (MISCELLANEOUS) IMPLANT
MANIFOLD NEPTUNE II (INSTRUMENTS) ×4 IMPLANT
NEEDLE BIOPSY 14X6 SOFT TISS (NEEDLE) ×4 IMPLANT
NS IRRIG 1000ML POUR BTL (IV SOLUTION) ×20 IMPLANT
PACK AORTA (CUSTOM PROCEDURE TRAY) ×2 IMPLANT
PAD ARMBOARD 7.5X6 YLW CONV (MISCELLANEOUS) ×4 IMPLANT
PENCIL BUTTON HOLSTER BLD 10FT (ELECTRODE) ×2 IMPLANT
SPONGE LAP 18X18 X RAY DECT (DISPOSABLE) ×4 IMPLANT
STAPLER VISISTAT 35W (STAPLE) ×2 IMPLANT
SUCTION POOLE TIP (SUCTIONS) ×4 IMPLANT
SUT BONE WAX W31G (SUTURE) ×2 IMPLANT
SUT ETHILON 1 LR 30 (SUTURE) ×4 IMPLANT
SUT PROLENE 4 0 RB 1 (SUTURE) ×4
SUT PROLENE 4 0 SH DA (SUTURE) ×4 IMPLANT
SUT PROLENE 4-0 RB1 18X2 ARM (SUTURE) ×2 IMPLANT
SUT PROLENE 6 0 BV (SUTURE) ×2 IMPLANT
SUT SILK 2 0 SH (SUTURE) IMPLANT
SUT SILK 2 0 SH CR/8 (SUTURE) ×4 IMPLANT
SUT SILK 4 0 TIE 10X30 (SUTURE) ×2 IMPLANT
SWAB COLLECTION DEVICE MRSA (MISCELLANEOUS) ×2 IMPLANT
SWAB CULTURE ESWAB REG 1ML (MISCELLANEOUS) ×2 IMPLANT
TAPE UMBILICAL COTTON 1/8X30 (MISCELLANEOUS) ×4 IMPLANT
TOWEL OR 17X26 10 PK STRL BLUE (TOWEL DISPOSABLE) ×4 IMPLANT
TRAP SPECIMEN MUCOUS 40CC (MISCELLANEOUS) ×2 IMPLANT
TUBE CONNECTING 12X1/4 (SUCTIONS) ×10 IMPLANT
TUBE CONNECTING 20X1/4 (TUBING) ×2 IMPLANT
WATER STERILE IRR 1000ML POUR (IV SOLUTION) IMPLANT
YANKAUER SUCT BULB TIP NO VENT (SUCTIONS) ×4 IMPLANT

## 2018-05-01 SURGICAL SUPPLY — 12 items
CATH 5FR JL3.5 JR4 ANG PIG MP (CATHETERS) ×2 IMPLANT
CATH SWAN GANZ 7F STRAIGHT (CATHETERS) ×2 IMPLANT
COVER PRB 48X5XTLSCP FOLD TPE (BAG) ×1 IMPLANT
COVER PROBE 5X48 (BAG) ×2
HOVERMATT SINGLE USE (MISCELLANEOUS) ×2 IMPLANT
KIT HEART LEFT (KITS) ×2 IMPLANT
PACK CARDIAC CATHETERIZATION (CUSTOM PROCEDURE TRAY) ×2 IMPLANT
SHEATH PINNACLE 5F 10CM (SHEATH) ×2 IMPLANT
SHEATH PINNACLE 7F 10CM (SHEATH) ×2 IMPLANT
TRANSDUCER W/STOPCOCK (MISCELLANEOUS) ×2 IMPLANT
TUBING CIL FLEX 10 FLL-RA (TUBING) ×2 IMPLANT
WIRE EMERALD 3MM-J .035X150CM (WIRE) ×2 IMPLANT

## 2018-05-01 NOTE — Progress Notes (Signed)
Pt transported on vent to OR.  Pt placed on OR vent.

## 2018-05-01 NOTE — Plan of Care (Signed)
Patient noted to be brain dead.  CDS managing vasopressors/fluids/electrolytes/ventilator.  Please page CCM at [phone] if any assistance is needed, otherwise we will sign off at this time  [name], MD [facility] Pager [phone] After hours pager: [phone]

## 2018-05-01 NOTE — Progress Notes (Signed)
Patient en route to OR with CRNA, OR RN & RT.

## 2018-05-01 NOTE — Anesthesia Preprocedure Evaluation (Signed)
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 CGPreop documentation limited or incomplete due to emergent nature of procedure.  Airway Mallampati: Intubated       Dental   Pulmonary           Cardiovascular      Neuro/Psych    GI/Hepatic   Endo/Other    Renal/GU      Musculoskeletal   Abdominal   Peds  Hematology   Anesthesia Other Findings   Reproductive/Obstetrics                             Anesthesia Physical Anesthesia Plan  ASA: VI and emergent  Anesthesia Plan: General   Post-op Pain Management:    Induction: Intravenous  PONV Risk Score and Plan: Treatment may vary due to age or medical condition  Airway Management Planned: Oral ETT  Additional Equipment:   Intra-op Plan:   Post-operative Plan:   Informed Consent:   Plan Discussed with: CRNA, Anesthesiologist and Surgeon  Anesthesia Plan Comments:         Anesthesia Quick Evaluation

## 2018-05-01 NOTE — Transfer of Care (Signed)
Immediate Anesthesia Transfer of Care Note  Patient: Cameron Krause  Procedure(s) Performed: ORGAN PROCUREMENT (N/A )  N/A

## 2018-05-01 NOTE — Anesthesia Postprocedure Evaluation (Signed)
Anesthesia Post Note  Patient: Daryll Brodimothy Gingrich  Procedure(s) Performed: ORGAN PROCUREMENT (N/A )     Patient location: organ donor case. Anesthesia Type: General    Last Vitals:  Vitals:   04/30/2018 1345 04/28/2018 1400  BP: 116/66 (!) 103/57  Pulse: (!) 57 (!) 58  Resp: (!) 35 (!) 28  Temp: 36.8 C 36.9 C  SpO2: 98% 98%    Last Pain:  Vitals:   04/25/2018 1200  TempSrc: Bladder  PainSc:                  Keygan Dumond

## 2018-05-02 ENCOUNTER — Encounter (HOSPITAL_COMMUNITY): Payer: Self-pay | Admitting: Interventional Cardiology

## 2018-05-02 LAB — TYPE AND SCREEN
ABO/RH(D): B NEG
Antibody Screen: NEGATIVE
UNIT DIVISION: 0
UNIT DIVISION: 0
UNIT DIVISION: 0
Unit division: 0
WEAK D: NEGATIVE

## 2018-05-02 LAB — BPAM RBC
BLOOD PRODUCT EXPIRATION DATE: 201907172359
BLOOD PRODUCT EXPIRATION DATE: 201907182359
BLOOD PRODUCT EXPIRATION DATE: 201907182359
Blood Product Expiration Date: 201907182359
ISSUE DATE / TIME: 201906150938
ISSUE DATE / TIME: 201906160759
ISSUE DATE / TIME: 201906151240
UNIT TYPE AND RH: 1700
UNIT TYPE AND RH: 1700
UNIT TYPE AND RH: 1700
Unit Type and Rh: 1700

## 2018-05-02 LAB — POCT I-STAT, CHEM 8
BUN: 30 mg/dL — ABNORMAL HIGH (ref 6–20)
CALCIUM ION: 1.37 mmol/L (ref 1.15–1.40)
CHLORIDE: 128 mmol/L — AB (ref 101–111)
Creatinine, Ser: 1.4 mg/dL — ABNORMAL HIGH (ref 0.61–1.24)
Glucose, Bld: 219 mg/dL — ABNORMAL HIGH (ref 65–99)
HCT: 24 % — ABNORMAL LOW (ref 39.0–52.0)
Hemoglobin: 8.2 g/dL — ABNORMAL LOW (ref 13.0–17.0)
Potassium: 4.4 mmol/L (ref 3.5–5.1)
SODIUM: 165 mmol/L — AB (ref 135–145)
TCO2: 23 mmol/L (ref 22–32)

## 2018-05-02 LAB — URINE CULTURE: CULTURE: NO GROWTH

## 2018-05-02 LAB — ABO/RH
ABO/RH(D): B NEG
WEAK D: NEGATIVE

## 2018-05-02 LAB — POCT I-STAT 3, VENOUS BLOOD GAS (G3P V)
ACID-BASE DEFICIT: 5 mmol/L — AB (ref 0.0–2.0)
Bicarbonate: 20.9 mmol/L (ref 20.0–28.0)
O2 SAT: 66 %
TCO2: 22 mmol/L (ref 22–32)
pCO2, Ven: 41.4 mmHg — ABNORMAL LOW (ref 44.0–60.0)
pH, Ven: 7.311 (ref 7.250–7.430)
pO2, Ven: 37 mmHg (ref 32.0–45.0)

## 2018-05-02 LAB — POCT I-STAT 3, ART BLOOD GAS (G3+)
Acid-base deficit: 4 mmol/L — ABNORMAL HIGH (ref 0.0–2.0)
Bicarbonate: 21.4 mmol/L (ref 20.0–28.0)
O2 Saturation: 100 %
PH ART: 7.331 — AB (ref 7.350–7.450)
TCO2: 23 mmol/L (ref 22–32)
pCO2 arterial: 40.5 mmHg (ref 32.0–48.0)
pO2, Arterial: 183 mmHg — ABNORMAL HIGH (ref 83.0–108.0)

## 2018-05-02 LAB — PATHOLOGIST SMEAR REVIEW

## 2018-05-02 MED FILL — Heparin Sod (Porcine)-NaCl IV Soln 1000 Unit/500ML-0.9%: INTRAVENOUS | Qty: 1000 | Status: AC

## 2018-05-02 MED FILL — Verapamil HCl IV Soln 2.5 MG/ML: INTRAVENOUS | Qty: 2 | Status: AC

## 2018-05-03 ENCOUNTER — Encounter (HOSPITAL_COMMUNITY): Payer: Self-pay | Admitting: Emergency Medicine

## 2018-05-03 LAB — CULTURE, BLOOD (ROUTINE X 2)
Culture: NO GROWTH
Culture: NO GROWTH

## 2018-05-06 LAB — AEROBIC/ANAEROBIC CULTURE W GRAM STAIN (SURGICAL/DEEP WOUND): Culture: NO GROWTH

## 2018-05-06 LAB — AEROBIC/ANAEROBIC CULTURE (SURGICAL/DEEP WOUND)

## 2018-05-16 NOTE — Progress Notes (Signed)
RT assisted in pt transport on vent to nuclear med and returned to 4N18. Vitals remained stable. No complications.

## 2018-05-16 NOTE — Progress Notes (Addendum)
CRITICAL VALUE ALERT  Critical Value:  Na 167 and Cl >130  Date & Time Notied:  04/28/2018 0620  Provider Notified: E-Link MD  Orders Received/Actions taken: No new orders at this time.

## 2018-05-16 NOTE — Progress Notes (Signed)
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  Not sure why this blossomed over the  Last 24 hours, but likely related to the extreme measure that we have had to take to manage his ARDS, hypoxemia and hypercapnia.  Will discuss with the family.  Prbably needs some volume to help with BP.  Marta LamasJames O. Gae BonWyatt, III, MD, FACS [phone] Trauma Surgeon

## 2018-05-16 NOTE — Progress Notes (Signed)
Pt transported to CT on vent, no complications  

## 2018-05-16 NOTE — Progress Notes (Signed)
Wasted 50cc of versed gtt in sink with Renato GailsAbby Allison, RN.

## 2018-05-16 NOTE — Progress Notes (Signed)
CRITICAL VALUE ALERT  Critical Value: Sodium 165 Chloride >130   Date & Time Notied:  04/30/2018    1030  Provider Notified: CCM notified  Orders Received/Actions taken: No new orders at this time.  Will continue to monitor

## 2018-05-16 NOTE — Progress Notes (Signed)
Pt transported to and from CT without event.  
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
## 2018-05-16 NOTE — Progress Notes (Signed)
  Echocardiogram 2D Echocardiogram has been performed.  Cameron Krause, Cameron Krause 05/05/2018, 8:37 AM

## 2018-05-16 NOTE — Progress Notes (Signed)
Patient ID: Cameron Krause, male   DOB: Jan 31, 1976, 42 y.o.   MRN: [id number]   Trauma - on call I was asked to follow-up on the results of the cerebral blood flow study.  The study shows no sign of blood flow and scintigraphic evidence of brain death.  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  Apparently, CDS has been contacted.  Time of death declared at 8:34 PM on 04/26/2018.  Death summary to be dictated by Dr. Lindie SpruceWyatt or a member of the trauma team.  This has been my only involvement with the patient's care.  Wilmon ArmsMatthew K. Corliss Skainssuei, MD, [facility]  General/ Trauma Surgery  05/08/2018 8:34 PM

## 2018-05-16 NOTE — Progress Notes (Signed)
Per MD no further orders for vent changes. Per MD to keep same vent settings

## 2018-05-16 NOTE — Progress Notes (Signed)
CRITICAL VALUE ALERT  Critical Value:  Na 169  Date & Time Notied:  04/27/2018 0150  Provider Notified: E-Link MD  Orders Received/Actions taken: D/C 0.9% NS @ 13350ml/hr and start 0.45% NS @ 11025ml/hr.
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
## 2018-05-16 DEATH — deceased
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
## 2019-01-11 IMAGING — DX DG CHEST 1V PORT
1 series · 1 of 1 positions shown · non-contrast
Comparison: 04/28/2018

CLINICAL DATA: Respiratory failure

EXAM:
PORTABLE CHEST 1 VIEW

[chest]
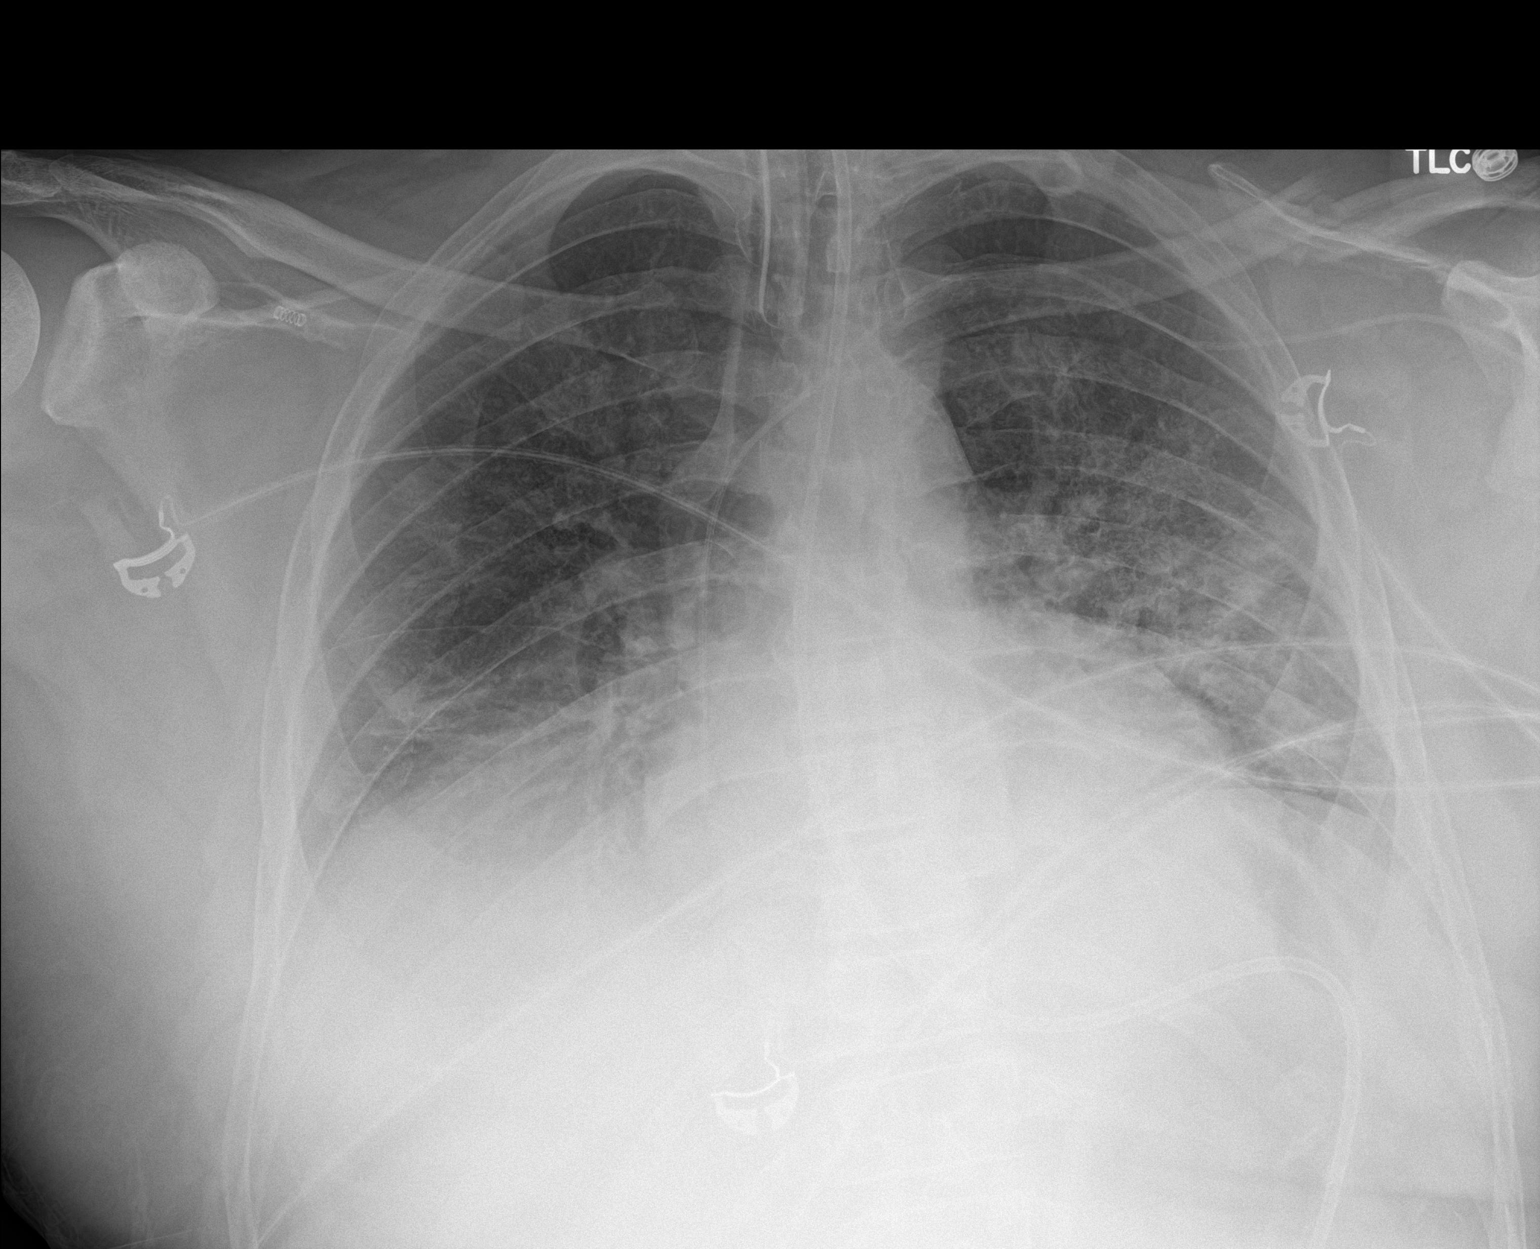

[1 of 1 positions shown; findings below may reference images not displayed]

FINDINGS: Cardiac shadow is stable. Left-sided PICC line, endotracheal tube
and feeding catheter are again noted and stable. Diffuse
infiltrative changes are identified bilaterally and stable from the
prior exam. No new focal abnormality is noted.
IMPRESSION: Tubes and lines as described.

Stable bilateral infiltrates.
